# Patient Record
Sex: Female | Born: 1935 | Race: White | Hispanic: No | Smoking: Former smoker
Health system: Southern US, Community
[De-identification: ages and names within clinical notes are randomized; demographics above are authoritative.]

## PROBLEM LIST (undated history)

## (undated) DIAGNOSIS — E119 Type 2 diabetes mellitus without complications: Secondary | ICD-10-CM

## (undated) DIAGNOSIS — I10 Essential (primary) hypertension: Secondary | ICD-10-CM

## (undated) DIAGNOSIS — E785 Hyperlipidemia, unspecified: Secondary | ICD-10-CM

## (undated) DIAGNOSIS — M1990 Unspecified osteoarthritis, unspecified site: Secondary | ICD-10-CM

## (undated) DIAGNOSIS — E1140 Type 2 diabetes mellitus with diabetic neuropathy, unspecified: Secondary | ICD-10-CM

## (undated) DIAGNOSIS — M810 Age-related osteoporosis without current pathological fracture: Secondary | ICD-10-CM

## (undated) DIAGNOSIS — H409 Unspecified glaucoma: Secondary | ICD-10-CM

## (undated) DIAGNOSIS — E876 Hypokalemia: Secondary | ICD-10-CM

## (undated) HISTORY — PX: APPENDECTOMY: SHX54

## (undated) HISTORY — DX: Unspecified osteoarthritis, unspecified site: M19.90

## (undated) HISTORY — DX: Hyperlipidemia, unspecified: E78.5

## (undated) HISTORY — PX: ABDOMINAL HYSTERECTOMY: SHX81

## (undated) HISTORY — PX: CHOLECYSTECTOMY: SHX55

## (undated) HISTORY — DX: Unspecified glaucoma: H40.9

## (undated) HISTORY — DX: Type 2 diabetes mellitus with diabetic neuropathy, unspecified: E11.40

## (undated) HISTORY — DX: Age-related osteoporosis without current pathological fracture: M81.0

## (undated) HISTORY — DX: Essential (primary) hypertension: I10

## (undated) HISTORY — DX: Type 2 diabetes mellitus without complications: E11.9

## (undated) HISTORY — DX: Hypokalemia: E87.6

## (undated) HISTORY — PX: FRACTURE SURGERY: SHX138

---

## 1997-11-25 ENCOUNTER — Encounter: Payer: Self-pay | Admitting: Emergency Medicine

## 1997-11-25 ENCOUNTER — Inpatient Hospital Stay (HOSPITAL_COMMUNITY): Admission: EM | Admit: 1997-11-25 | Discharge: 1997-11-28 | Payer: Self-pay | Admitting: Emergency Medicine

## 1997-11-25 ENCOUNTER — Encounter: Payer: Self-pay | Admitting: Specialist

## 2006-04-24 ENCOUNTER — Ambulatory Visit (HOSPITAL_COMMUNITY): Admission: RE | Admit: 2006-04-24 | Discharge: 2006-04-24 | Payer: Self-pay | Admitting: Ophthalmology

## 2010-03-02 LAB — HEMOGLOBIN A1C: Hgb A1c MFr Bld: 7.1 % — AB (ref 4.0–6.0)

## 2010-03-02 LAB — HM PAP SMEAR

## 2010-04-09 ENCOUNTER — Encounter: Payer: Self-pay | Admitting: Nurse Practitioner

## 2010-04-09 DIAGNOSIS — I10 Essential (primary) hypertension: Secondary | ICD-10-CM | POA: Insufficient documentation

## 2010-04-09 DIAGNOSIS — E119 Type 2 diabetes mellitus without complications: Secondary | ICD-10-CM | POA: Insufficient documentation

## 2010-04-09 DIAGNOSIS — M1990 Unspecified osteoarthritis, unspecified site: Secondary | ICD-10-CM

## 2010-04-09 DIAGNOSIS — E1140 Type 2 diabetes mellitus with diabetic neuropathy, unspecified: Secondary | ICD-10-CM | POA: Insufficient documentation

## 2010-04-09 DIAGNOSIS — F411 Generalized anxiety disorder: Secondary | ICD-10-CM | POA: Insufficient documentation

## 2010-04-09 DIAGNOSIS — E785 Hyperlipidemia, unspecified: Secondary | ICD-10-CM | POA: Insufficient documentation

## 2012-04-18 ENCOUNTER — Other Ambulatory Visit: Payer: Self-pay | Admitting: Nurse Practitioner

## 2012-05-17 ENCOUNTER — Ambulatory Visit: Payer: Self-pay | Admitting: Nurse Practitioner

## 2012-05-24 ENCOUNTER — Other Ambulatory Visit: Payer: Self-pay | Admitting: Nurse Practitioner

## 2012-05-25 ENCOUNTER — Other Ambulatory Visit: Payer: Self-pay | Admitting: Nurse Practitioner

## 2012-06-21 ENCOUNTER — Encounter: Payer: Self-pay | Admitting: Nurse Practitioner

## 2012-06-21 ENCOUNTER — Ambulatory Visit (INDEPENDENT_AMBULATORY_CARE_PROVIDER_SITE_OTHER): Payer: Medicare Other | Admitting: Nurse Practitioner

## 2012-06-21 VITALS — BP 141/64 | HR 68 | Temp 98.6°F | Ht 64.0 in | Wt 245.0 lb

## 2012-06-21 DIAGNOSIS — E785 Hyperlipidemia, unspecified: Secondary | ICD-10-CM

## 2012-06-21 DIAGNOSIS — E1142 Type 2 diabetes mellitus with diabetic polyneuropathy: Secondary | ICD-10-CM

## 2012-06-21 DIAGNOSIS — E1140 Type 2 diabetes mellitus with diabetic neuropathy, unspecified: Secondary | ICD-10-CM

## 2012-06-21 DIAGNOSIS — F3289 Other specified depressive episodes: Secondary | ICD-10-CM

## 2012-06-21 DIAGNOSIS — F32A Depression, unspecified: Secondary | ICD-10-CM

## 2012-06-21 DIAGNOSIS — E1149 Type 2 diabetes mellitus with other diabetic neurological complication: Secondary | ICD-10-CM

## 2012-06-21 DIAGNOSIS — I10 Essential (primary) hypertension: Secondary | ICD-10-CM

## 2012-06-21 DIAGNOSIS — E119 Type 2 diabetes mellitus without complications: Secondary | ICD-10-CM

## 2012-06-21 DIAGNOSIS — F329 Major depressive disorder, single episode, unspecified: Secondary | ICD-10-CM

## 2012-06-21 LAB — COMPLETE METABOLIC PANEL WITH GFR
ALT: 22 U/L (ref 0–35)
AST: 18 U/L (ref 0–37)
Alkaline Phosphatase: 58 U/L (ref 39–117)
Creat: 0.97 mg/dL (ref 0.50–1.10)
Sodium: 143 mEq/L (ref 135–145)
Total Bilirubin: 0.4 mg/dL (ref 0.3–1.2)

## 2012-06-21 LAB — POCT GLYCOSYLATED HEMOGLOBIN (HGB A1C): Hemoglobin A1C: 7

## 2012-06-21 NOTE — Progress Notes (Signed)
Subjective:    Patient ID: Kimberly Mcdowell, female    DOB: 16-Oct-1930, 79 y.o.   MRN: [id number]  Hypertension This is a chronic problem. The current episode started more than 1 year ago. The problem is unchanged. The problem is uncontrolled (At home blood pressure runs in 140's systolic.). Pertinent negatives include no blurred vision, chest pain, headaches, malaise/fatigue, peripheral edema or shortness of breath. There are no associated agents to hypertension. Risk factors for coronary artery disease include diabetes mellitus, dyslipidemia, obesity, post-menopausal state and sedentary lifestyle. Past treatments include diuretics, ACE inhibitors and beta blockers. The current treatment provides moderate improvement. Compliance problems include diet and exercise.   Hyperlipidemia This is a chronic problem. The current episode started more than 1 year ago. The problem is controlled. Recent lipid tests were reviewed and are normal. Exacerbating diseases include diabetes. She has no history of hypothyroidism or liver disease. There are no known factors aggravating her hyperlipidemia. Pertinent negatives include no chest pain or shortness of breath. Current antihyperlipidemic treatment includes statins. The current treatment provides moderate improvement of lipids. Compliance problems include adherence to diet and adherence to exercise.  Risk factors for coronary artery disease include diabetes mellitus, hypertension, obesity, post-menopausal and a sedentary lifestyle.  Diabetes She presents for her follow-up diabetic visit. She has type 2 diabetes mellitus. No MedicAlert identification noted. The initial diagnosis of diabetes was made 20 years ago. Her disease course has been stable. There are no hypoglycemic associated symptoms. Pertinent negatives for hypoglycemia include no headaches. Pertinent negatives for diabetes include no blurred vision, no chest pain, no polydipsia, no polyphagia and no weakness.  There are no hypoglycemic complications. Symptoms are stable. There are no diabetic complications. Risk factors for coronary artery disease include dyslipidemia, hypertension, obesity, post-menopausal and sedentary lifestyle. When asked about current treatments, none were reported. She is compliant with treatment most of the time. Her weight is fluctuating minimally. When asked about meal planning, she reported none. She has not had a previous visit with a dietician. She rarely participates in exercise. Her home blood glucose trend is fluctuating minimally. Her breakfast blood glucose is taken between 8-9 am. Her breakfast blood glucose range is generally 130-140 mg/dl. Her highest blood glucose is >200 mg/dl. An ACE inhibitor/angiotensin II receptor blocker is being taken. She does not see a podiatrist.Eye exam is current (May 2014).  depression Celexa- Keeps her calm and from worrying so much Diabetic neuropathy bil legs and feet Neurotin helps with discomfort- No medication side effects   Review of Systems  Constitutional: Negative for malaise/fatigue.  Eyes: Negative for blurred vision.  Respiratory: Negative for shortness of breath.   Cardiovascular: Negative for chest pain.  Endocrine: Negative for polydipsia and polyphagia.  Neurological: Negative for weakness and headaches.  All other systems reviewed and are negative.       Objective:   Physical Exam  Constitutional: She is oriented to person, place, and time. She appears well-developed and well-nourished.  HENT:  Nose: Nose normal.  Mouth/Throat: Oropharynx is clear and moist.  Eyes: EOM are normal.  Neck: Trachea normal, normal range of motion and full passive range of motion without pain. Neck supple. No JVD present. Carotid bruit is not present. No thyromegaly present.  Cardiovascular: Normal rate, regular rhythm, normal heart sounds and intact distal pulses.  Exam reveals no gallop and no friction rub.   No murmur  heard. Pulmonary/Chest: Effort normal and breath sounds normal.  Abdominal: Soft. Bowel sounds are normal. She exhibits no  distension and no mass. There is no tenderness.  Musculoskeletal: Normal range of motion.  Lymphadenopathy:    She has no cervical adenopathy.  Neurological: She is alert and oriented to person, place, and time. She has normal reflexes.  Skin: Skin is warm and dry.  Psychiatric: She has a normal mood and affect. Her behavior is normal. Judgment and thought content normal.   BP 141/64  Pulse 68  Temp(Src) 98.6 F (37 C) (Oral)  Ht 5\' 4"  (1.626 m)  Wt 245 lb (111.131 kg)  BMI 42.03 kg/m2 Results for orders placed in visit on 06/21/12  POCT GLYCOSYLATED HEMOGLOBIN (HGB A1C)      Result Value Range   Hemoglobin A1C 7.0           Assessment & Plan:   1. Diabetes mellitus   2. Hyperlipidemia   3. Hypertension   4. Diabetic neuropathy, type II diabetes mellitus   5. Depression    Orders Placed This Encounter  Procedures  . COMPLETE METABOLIC PANEL WITH GFR  . NMR Lipoprofile with Lipids  . POCT glycosylated hemoglobin (Hb A1C)     Medication List       These changes are accurate as of: 06/21/2012 11:31 AM. If you have any questions, ask your nurse or doctor.          TAKE these medications       amLODipine 10 MG tablet  Commonly known as:  NORVASC  TAKE ONE TABLET BY MOUTH ONE TIME DAILY     benazepril 40 MG tablet  Commonly known as:  LOTENSIN  Take 40 mg by mouth daily.     citalopram 20 MG tablet  Commonly known as:  CELEXA  TAKE ONE TABLET BY MOUTH ONE TIME DAILY     furosemide 40 MG tablet  Commonly known as:  LASIX  Take 40 mg by mouth daily.     gabapentin 100 MG capsule  Commonly known as:  NEURONTIN  Take 100 mg by mouth 3 (three) times daily.     glipiZIDE 2.5 MG 24 hr tablet  Commonly known as:  GLUCOTROL XL  Take 2.5 mg by mouth daily.     meclizine 25 MG tablet  Commonly known as:  ANTIVERT  Take 25 mg by mouth 3  (three) times daily as needed.     metFORMIN 1000 MG tablet  Commonly known as:  GLUCOPHAGE  TAKE ONE TABLET BY MOUTH TWICE DAILY     metoprolol 50 MG tablet  Commonly known as:  LOPRESSOR  Take 50 mg by mouth 2 (two) times daily.     pravastatin 40 MG tablet  Commonly known as:  PRAVACHOL  Take 40 mg by mouth daily.     Vitamin D3 2000 UNITS Tabs  Take 2,000 Int'l Units by mouth daily.       Continue all meds Labs pending Diabetic diet encouraged Exercise Health maintenance reviewed- Patient refuses Dexa and mammogram Low Na+ diet  Mary-Margaret Daphine Deutscher, FNP

## 2012-06-21 NOTE — Patient Instructions (Signed)

## 2012-06-25 ENCOUNTER — Other Ambulatory Visit: Payer: Self-pay | Admitting: Nurse Practitioner

## 2012-06-25 LAB — NMR LIPOPROFILE WITH LIPIDS
Cholesterol, Total: 183 mg/dL (ref <200)
HDL Particle Number: 36.9 umol/L (ref >=30.5)
HDL-C: 51 mg/dL (ref >=40)
LDL (calc): 104 mg/dL — ABNORMAL HIGH (ref <100)
LDL Size: 20.6 nm (ref >20.5)
LP-IR Score: 62 — ABNORMAL HIGH (ref <=45)
Triglycerides: 142 mg/dL (ref <150)

## 2012-06-25 MED ORDER — ATORVASTATIN CALCIUM 40 MG PO TABS: 40.0000 mg | ORAL_TABLET | Freq: Every day | ORAL | Status: DC

## 2012-07-03 ENCOUNTER — Other Ambulatory Visit: Payer: Self-pay | Admitting: Nurse Practitioner

## 2012-07-08 ENCOUNTER — Other Ambulatory Visit: Payer: Self-pay | Admitting: *Deleted

## 2012-07-08 MED ORDER — GABAPENTIN 100 MG PO CAPS: 100.0000 mg | ORAL_CAPSULE | Freq: Three times a day (TID) | ORAL | Status: DC

## 2012-08-09 ENCOUNTER — Other Ambulatory Visit: Payer: Self-pay | Admitting: Nurse Practitioner

## 2012-09-12 ENCOUNTER — Other Ambulatory Visit: Payer: Self-pay | Admitting: Nurse Practitioner

## 2012-09-30 ENCOUNTER — Encounter: Payer: Self-pay | Admitting: Nurse Practitioner

## 2012-09-30 ENCOUNTER — Ambulatory Visit (INDEPENDENT_AMBULATORY_CARE_PROVIDER_SITE_OTHER): Payer: Medicare Other | Admitting: Nurse Practitioner

## 2012-09-30 VITALS — BP 156/61 | HR 66 | Temp 97.0°F | Ht 64.0 in | Wt 245.0 lb

## 2012-09-30 DIAGNOSIS — E1142 Type 2 diabetes mellitus with diabetic polyneuropathy: Secondary | ICD-10-CM

## 2012-09-30 DIAGNOSIS — F32A Depression, unspecified: Secondary | ICD-10-CM

## 2012-09-30 DIAGNOSIS — E785 Hyperlipidemia, unspecified: Secondary | ICD-10-CM

## 2012-09-30 DIAGNOSIS — E1149 Type 2 diabetes mellitus with other diabetic neurological complication: Secondary | ICD-10-CM

## 2012-09-30 DIAGNOSIS — E1140 Type 2 diabetes mellitus with diabetic neuropathy, unspecified: Secondary | ICD-10-CM

## 2012-09-30 DIAGNOSIS — E119 Type 2 diabetes mellitus without complications: Secondary | ICD-10-CM

## 2012-09-30 DIAGNOSIS — I10 Essential (primary) hypertension: Secondary | ICD-10-CM

## 2012-09-30 DIAGNOSIS — F329 Major depressive disorder, single episode, unspecified: Secondary | ICD-10-CM

## 2012-09-30 DIAGNOSIS — F3289 Other specified depressive episodes: Secondary | ICD-10-CM

## 2012-09-30 LAB — POCT GLYCOSYLATED HEMOGLOBIN (HGB A1C): Hemoglobin A1C: 6.7

## 2012-09-30 MED ORDER — BENAZEPRIL HCL 40 MG PO TABS: 40.0000 mg | ORAL_TABLET | Freq: Every day | ORAL | Status: DC

## 2012-09-30 MED ORDER — CITALOPRAM HYDROBROMIDE 20 MG PO TABS: 20.0000 mg | ORAL_TABLET | Freq: Every day | ORAL | Status: DC

## 2012-09-30 MED ORDER — METOPROLOL TARTRATE 50 MG PO TABS: 50.0000 mg | ORAL_TABLET | Freq: Two times a day (BID) | ORAL | Status: DC

## 2012-09-30 MED ORDER — GLIPIZIDE ER 2.5 MG PO TB24: 2.5000 mg | ORAL_TABLET | Freq: Every day | ORAL | Status: DC

## 2012-09-30 MED ORDER — ATORVASTATIN CALCIUM 40 MG PO TABS: 40.0000 mg | ORAL_TABLET | Freq: Every day | ORAL | Status: DC

## 2012-09-30 MED ORDER — GABAPENTIN 100 MG PO CAPS: 100.0000 mg | ORAL_CAPSULE | Freq: Three times a day (TID) | ORAL | Status: DC

## 2012-09-30 MED ORDER — METFORMIN HCL 1000 MG PO TABS: 1000.0000 mg | ORAL_TABLET | Freq: Two times a day (BID) | ORAL | Status: DC

## 2012-09-30 NOTE — Progress Notes (Signed)
Subjective:    Patient ID: Kimberly Mcdowell, female    DOB: [birth year], 79 y.o.   MRN: [id number]  Hypertension This is a chronic problem. The current episode started more than 1 year ago. The problem is unchanged. The problem is uncontrolled (At home blood pressure runs in 140's systolic.). Pertinent negatives include no blurred vision, chest pain, headaches, malaise/fatigue, peripheral edema or shortness of breath. There are no associated agents to hypertension. Risk factors for coronary artery disease include diabetes mellitus, dyslipidemia, obesity, post-menopausal state and sedentary lifestyle. Past treatments include diuretics, ACE inhibitors and beta blockers. The current treatment provides moderate improvement. Compliance problems include diet and exercise.   Hyperlipidemia This is a chronic problem. The current episode started more than 1 year ago. The problem is controlled. Recent lipid tests were reviewed and are normal. Exacerbating diseases include diabetes. She has no history of hypothyroidism or liver disease. There are no known factors aggravating her hyperlipidemia. Pertinent negatives include no chest pain or shortness of breath. Current antihyperlipidemic treatment includes statins. The current treatment provides moderate improvement of lipids. Compliance problems include adherence to diet and adherence to exercise.  Risk factors for coronary artery disease include diabetes mellitus, hypertension, obesity, post-menopausal and a sedentary lifestyle.  Diabetes She presents for her follow-up diabetic visit. She has type 2 diabetes mellitus. No MedicAlert identification noted. The initial diagnosis of diabetes was made 20 years ago. Her disease course has been stable. There are no hypoglycemic associated symptoms. Pertinent negatives for hypoglycemia include no headaches. Pertinent negatives for diabetes include no blurred vision, no chest pain, no polydipsia, no polyphagia and no weakness.  There are no hypoglycemic complications. Symptoms are stable. There are no diabetic complications. Risk factors for coronary artery disease include dyslipidemia, hypertension, obesity, post-menopausal and sedentary lifestyle. When asked about current treatments, none were reported. She is compliant with treatment most of the time. Her weight is fluctuating minimally. When asked about meal planning, she reported none. She has not had a previous visit with a dietician. She rarely participates in exercise. Her home blood glucose trend is fluctuating minimally. Her breakfast blood glucose is taken between 8-9 am. Her breakfast blood glucose range is generally 130-140 mg/dl. Her highest blood glucose is >200 mg/dl. An ACE inhibitor/angiotensin II receptor blocker is being taken. She does not see a podiatrist.Eye exam is current (May 2014).  depression Celexa- Keeps her calm and from worrying so much Diabetic neuropathy bil legs and feet Neurotin helps with discomfort- No medication side effects   Review of Systems  Constitutional: Negative for malaise/fatigue.  Eyes: Negative for blurred vision.  Respiratory: Negative for shortness of breath.   Cardiovascular: Negative for chest pain.  Endocrine: Negative for polydipsia and polyphagia.  Neurological: Negative for weakness and headaches.  All other systems reviewed and are negative.       Objective:   Physical Exam  Constitutional: She is oriented to person, place, and time. She appears well-developed and well-nourished.  HENT:  Nose: Nose normal.  Mouth/Throat: Oropharynx is clear and moist.  Eyes: EOM are normal.  Neck: Trachea normal, normal range of motion and full passive range of motion without pain. Neck supple. No JVD present. Carotid bruit is not present. No thyromegaly present.  Cardiovascular: Normal rate, regular rhythm, normal heart sounds and intact distal pulses.  Exam reveals no gallop and no friction rub.   No murmur  heard. Pulmonary/Chest: Effort normal and breath sounds normal.  Abdominal: Soft. Bowel sounds are normal. She exhibits no  distension and no mass. There is no tenderness.  Musculoskeletal: Normal range of motion.  Lymphadenopathy:    She has no cervical adenopathy.  Neurological: She is alert and oriented to person, place, and time. She has normal reflexes.  Skin: Skin is warm and dry.  Psychiatric: She has a normal mood and affect. Her behavior is normal. Judgment and thought content normal.   BP 156/61  Pulse 66  Temp(Src) 97 F (36.1 C) (Oral)  Ht 5\' 4"  (1.626 m)  Wt 245 lb (111.131 kg)  BMI 42.03 kg/m2 Results for orders placed in visit on 09/30/12  POCT GLYCOSYLATED HEMOGLOBIN (HGB A1C)      Result Value Range   Hemoglobin A1C 6.7           Assessment & Plan:   1. Diabetes   2. Accelerated hypertension   3. Hyperlipemia    Orders Placed This Encounter  Procedures  . CMP14+EGFR  . NMR, lipoprofile  . POCT glycosylated hemoglobin (Hb A1C)     Medication List       This list is accurate as of: 09/30/12 10:53 AM.  Always use your most recent med list.               atorvastatin 40 MG tablet  Commonly known as:  LIPITOR  Take 1 tablet (40 mg total) by mouth daily.     benazepril 40 MG tablet  Commonly known as:  LOTENSIN  Take 40 mg by mouth daily.     citalopram 20 MG tablet  Commonly known as:  CELEXA  TAKE ONE TABLET BY MOUTH ONE TIME DAILY     furosemide 40 MG tablet  Commonly known as:  LASIX  Take 40 mg by mouth daily.     gabapentin 100 MG capsule  Commonly known as:  NEURONTIN  Take 1 capsule (100 mg total) by mouth 3 (three) times daily.     glipiZIDE 2.5 MG 24 hr tablet  Commonly known as:  GLUCOTROL XL  Take 2.5 mg by mouth daily.     meclizine 25 MG tablet  Commonly known as:  ANTIVERT  Take 25 mg by mouth 3 (three) times daily as needed.     metFORMIN 1000 MG tablet  Commonly known as:  GLUCOPHAGE  TAKE ONE TABLET BY MOUTH TWICE  DAILY     metoprolol 50 MG tablet  Commonly known as:  LOPRESSOR  TAKE ONE TABLET BY MOUTH TWICE DAILY     Vitamin D3 2000 UNITS Tabs  Take 2,000 Int'l Units by mouth daily.       Continue all meds Labs pending Diabetic diet encouraged Exercise Health maintenance reviewed- Patient refuses Dexa and mammogram Low Na+ diet  Mary-Margaret Daphine Deutscher, FNP

## 2012-09-30 NOTE — Patient Instructions (Signed)

## 2012-10-02 LAB — CMP14+EGFR
ALT: 14 IU/L (ref 0–32)
AST: 12 IU/L (ref 0–40)
Albumin/Globulin Ratio: 2.2 (ref 1.1–2.5)
CO2: 25 mmol/L (ref 18–29)
Calcium: 9.5 mg/dL (ref 8.6–10.2)
Creatinine, Ser: 0.87 mg/dL (ref 0.57–1.00)
GFR calc non Af Amer: 64 mL/min/{1.73_m2} (ref >59)
Glucose: 142 mg/dL — ABNORMAL HIGH (ref 65–99)
Potassium: 5.9 mmol/L — ABNORMAL HIGH (ref 3.5–5.2)
Total Protein: 6.3 g/dL (ref 6.0–8.5)

## 2012-10-02 LAB — NMR, LIPOPROFILE
HDL Cholesterol by NMR: 47 mg/dL (ref >=40)
LDLC SERPL CALC-MCNC: 61 mg/dL (ref <100)
Small LDL Particle Number: 845 nmol/L — ABNORMAL HIGH (ref <=527)
Triglycerides by NMR: 121 mg/dL (ref <150)

## 2012-10-21 NOTE — Progress Notes (Signed)
Pt.notified

## 2013-01-03 ENCOUNTER — Ambulatory Visit (INDEPENDENT_AMBULATORY_CARE_PROVIDER_SITE_OTHER): Payer: Medicare Other | Admitting: Nurse Practitioner

## 2013-01-03 ENCOUNTER — Encounter: Payer: Self-pay | Admitting: Nurse Practitioner

## 2013-01-03 VITALS — BP 156/51 | HR 62 | Temp 97.8°F | Ht 64.0 in | Wt 242.0 lb

## 2013-01-03 DIAGNOSIS — E1142 Type 2 diabetes mellitus with diabetic polyneuropathy: Secondary | ICD-10-CM

## 2013-01-03 DIAGNOSIS — E1149 Type 2 diabetes mellitus with other diabetic neurological complication: Secondary | ICD-10-CM

## 2013-01-03 DIAGNOSIS — E785 Hyperlipidemia, unspecified: Secondary | ICD-10-CM

## 2013-01-03 DIAGNOSIS — E1140 Type 2 diabetes mellitus with diabetic neuropathy, unspecified: Secondary | ICD-10-CM

## 2013-01-03 DIAGNOSIS — I10 Essential (primary) hypertension: Secondary | ICD-10-CM

## 2013-01-03 DIAGNOSIS — F411 Generalized anxiety disorder: Secondary | ICD-10-CM

## 2013-01-03 DIAGNOSIS — M129 Arthropathy, unspecified: Secondary | ICD-10-CM

## 2013-01-03 DIAGNOSIS — E119 Type 2 diabetes mellitus without complications: Secondary | ICD-10-CM

## 2013-01-03 DIAGNOSIS — M1990 Unspecified osteoarthritis, unspecified site: Secondary | ICD-10-CM

## 2013-01-03 LAB — POCT GLYCOSYLATED HEMOGLOBIN (HGB A1C): Hemoglobin A1C: 6.7

## 2013-01-03 MED ORDER — CITALOPRAM HYDROBROMIDE 40 MG PO TABS: 40.0000 mg | ORAL_TABLET | Freq: Every day | ORAL | Status: DC

## 2013-01-03 NOTE — Patient Instructions (Signed)
Stress Management Stress is a state of physical or mental tension that often results from changes in your life or normal routine. Some common causes of stress are:  Death of a loved one.  Injuries or severe illnesses.  Getting fired or changing jobs.  Moving into a new home. Other causes may be:  Sexual problems.  Business or financial losses.  Taking on a large debt.  Regular conflict with someone at home or at work.  Constant tiredness from lack of sleep. It is not just bad things that are stressful. It may be stressful to:  Win the lottery.  Get married.  Buy a new car. The amount of stress that can be easily tolerated varies from person to person. Changes generally cause stress, regardless of the types of change. Too much stress can affect your health. It may lead to physical or emotional problems. Too little stress (boredom) may also become stressful. SUGGESTIONS TO REDUCE STRESS:  Talk things over with your family and friends. It often is helpful to share your concerns and worries. If you feel your problem is serious, you may want to get help from a professional counselor.  Consider your problems one at a time instead of lumping them all together. Trying to take care of everything at once may seem impossible. List all the things you need to do and then start with the most important one. Set a goal to accomplish 2 or 3 things each day. If you expect to do too many in a single day you will naturally fail, causing you to feel even more stressed.  Do not use alcohol or drugs to relieve stress. Although you may feel better for a short time, they do not remove the problems that caused the stress. They can also be habit forming.  Exercise regularly - at least 3 times per week. Physical exercise can help to relieve that "uptight" feeling and will relax you.  The shortest distance between despair and hope is often a good night's sleep.  Go to bed and get up on time allowing  yourself time for appointments without being rushed.  Take a short "time-out" period from any stressful situation that occurs during the day. Close your eyes and take some deep breaths. Starting with the muscles in your face, tense them, hold it for a few seconds, then relax. Repeat this with the muscles in your neck, shoulders, hand, stomach, back and legs.  Take good care of yourself. Eat a balanced diet and get plenty of rest.  Schedule time for having fun. Take a break from your daily routine to relax. HOME CARE INSTRUCTIONS   Call if you feel overwhelmed by your problems and feel you can no longer manage them on your own.  Return immediately if you feel like hurting yourself or someone else. Document Released: 07/05/2000 Document Revised: 04/03/2011 Document Reviewed: 09/03/2012 [employer] Patient Information 2014 [employer].  
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
## 2013-01-05 LAB — CMP14+EGFR
ALT: 11 IU/L (ref 0–32)
AST: 10 IU/L (ref 0–40)
CO2: 23 mmol/L (ref 18–29)
Calcium: 9.4 mg/dL (ref 8.6–10.2)
Creatinine, Ser: 0.92 mg/dL (ref 0.57–1.00)
Glucose: 127 mg/dL — ABNORMAL HIGH (ref 65–99)
Potassium: 5.7 mmol/L — ABNORMAL HIGH (ref 3.5–5.2)
Sodium: 144 mmol/L (ref 134–144)
Total Protein: 6.2 g/dL (ref 6.0–8.5)

## 2013-01-05 LAB — NMR, LIPOPROFILE
HDL Cholesterol by NMR: 49 mg/dL (ref >=40)
LDLC SERPL CALC-MCNC: 57 mg/dL (ref <100)

## 2013-02-19 ENCOUNTER — Telehealth: Payer: Self-pay | Admitting: Nurse Practitioner

## 2013-02-19 MED ORDER — BENZONATATE 100 MG PO CAPS: 100.0000 mg | ORAL_CAPSULE | Freq: Two times a day (BID) | ORAL | Status: DC | PRN

## 2013-02-19 NOTE — Telephone Encounter (Signed)
rx for tesalon perles sent to pharmacy for cough

## 2013-02-19 NOTE — Telephone Encounter (Signed)
Please review

## 2013-05-02 ENCOUNTER — Other Ambulatory Visit: Payer: Self-pay | Admitting: Nurse Practitioner

## 2013-05-12 ENCOUNTER — Other Ambulatory Visit: Payer: Self-pay | Admitting: *Deleted

## 2013-05-12 DIAGNOSIS — I10 Essential (primary) hypertension: Secondary | ICD-10-CM

## 2013-05-12 MED ORDER — BENAZEPRIL HCL 40 MG PO TABS: 40.0000 mg | ORAL_TABLET | Freq: Every day | ORAL | Status: DC

## 2013-05-12 NOTE — Telephone Encounter (Signed)
Last ov 01/03/13

## 2013-05-12 NOTE — Telephone Encounter (Signed)
Patient NTBS for follow up and lab work  

## 2013-05-13 ENCOUNTER — Other Ambulatory Visit: Payer: Self-pay | Admitting: Nurse Practitioner

## 2013-05-15 NOTE — Telephone Encounter (Signed)
Last seen and last glucose 12/24/12  MMM  Requesting 90 day supply

## 2013-05-15 NOTE — Telephone Encounter (Signed)
Patient NTBS for follow up and lab work  

## 2013-05-21 ENCOUNTER — Encounter: Payer: Self-pay | Admitting: *Deleted

## 2013-06-04 ENCOUNTER — Ambulatory Visit: Payer: Medicare Other | Admitting: Nurse Practitioner

## 2013-06-05 ENCOUNTER — Ambulatory Visit (INDEPENDENT_AMBULATORY_CARE_PROVIDER_SITE_OTHER): Payer: Medicare HMO | Admitting: Nurse Practitioner

## 2013-06-05 ENCOUNTER — Encounter: Payer: Self-pay | Admitting: Nurse Practitioner

## 2013-06-05 VITALS — BP 172/67 | HR 67 | Temp 99.2°F | Ht 64.0 in | Wt 238.4 lb

## 2013-06-05 DIAGNOSIS — I10 Essential (primary) hypertension: Secondary | ICD-10-CM

## 2013-06-05 DIAGNOSIS — E1142 Type 2 diabetes mellitus with diabetic polyneuropathy: Secondary | ICD-10-CM

## 2013-06-05 DIAGNOSIS — J209 Acute bronchitis, unspecified: Secondary | ICD-10-CM

## 2013-06-05 DIAGNOSIS — F411 Generalized anxiety disorder: Secondary | ICD-10-CM

## 2013-06-05 DIAGNOSIS — M1990 Unspecified osteoarthritis, unspecified site: Secondary | ICD-10-CM

## 2013-06-05 DIAGNOSIS — E1149 Type 2 diabetes mellitus with other diabetic neurological complication: Secondary | ICD-10-CM

## 2013-06-05 DIAGNOSIS — E119 Type 2 diabetes mellitus without complications: Secondary | ICD-10-CM

## 2013-06-05 DIAGNOSIS — E785 Hyperlipidemia, unspecified: Secondary | ICD-10-CM

## 2013-06-05 DIAGNOSIS — M129 Arthropathy, unspecified: Secondary | ICD-10-CM

## 2013-06-05 DIAGNOSIS — E1140 Type 2 diabetes mellitus with diabetic neuropathy, unspecified: Secondary | ICD-10-CM

## 2013-06-05 LAB — POCT GLYCOSYLATED HEMOGLOBIN (HGB A1C)

## 2013-06-05 MED ORDER — AMOXICILLIN 875 MG PO TABS: 875.0000 mg | ORAL_TABLET | Freq: Two times a day (BID) | ORAL | Status: DC

## 2013-06-05 MED ORDER — BENZONATATE 100 MG PO CAPS: 100.0000 mg | ORAL_CAPSULE | Freq: Two times a day (BID) | ORAL | Status: DC | PRN

## 2013-06-05 NOTE — Patient Instructions (Signed)

## 2013-06-05 NOTE — Progress Notes (Signed)
Subjective:    Patient ID: Kimberly Mcdowell, female    DOB: 06-25-1930, 80 y.o.   MRN: [id number]  Patient here today for follow up of chronic medical problems.  Cough This is a recurrent problem. The current episode started in the past 7 days. Associated symptoms include a fever (low grade), nasal congestion, postnasal drip and a sore throat. Pertinent negatives include no chest pain, headaches or shortness of breath. The symptoms are aggravated by lying down. The treatment provided no relief. Her past medical history is significant for bronchitis.  Hypertension This is a chronic problem. The current episode started more than 1 year ago. The problem is unchanged. The problem is uncontrolled (At home blood pressure runs in 169'C systolic.). Pertinent negatives include no blurred vision, chest pain, headaches, malaise/fatigue, peripheral edema or shortness of breath. There are no associated agents to hypertension. Risk factors for coronary artery disease include diabetes mellitus, dyslipidemia, obesity, post-menopausal state and sedentary lifestyle. Past treatments include diuretics, ACE inhibitors and beta blockers. The current treatment provides moderate improvement. Compliance problems include diet and exercise.   Hyperlipidemia This is a chronic problem. The current episode started more than 1 year ago. The problem is controlled. Recent lipid tests were reviewed and are normal. Exacerbating diseases include diabetes. She has no history of hypothyroidism or liver disease. There are no known factors aggravating her hyperlipidemia. Pertinent negatives include no chest pain or shortness of breath. Current antihyperlipidemic treatment includes statins. The current treatment provides moderate improvement of lipids. Compliance problems include adherence to diet and adherence to exercise.  Risk factors for coronary artery disease include diabetes mellitus, hypertension, obesity, post-menopausal and a sedentary  lifestyle.  Diabetes She presents for her follow-up diabetic visit. She has type 2 diabetes mellitus. No MedicAlert identification noted. The initial diagnosis of diabetes was made 20 years ago. Her disease course has been stable. There are no hypoglycemic associated symptoms. Pertinent negatives for hypoglycemia include no headaches. Pertinent negatives for diabetes include no blurred vision, no chest pain, no polydipsia, no polyphagia and no weakness. There are no hypoglycemic complications. Symptoms are stable. There are no diabetic complications. Risk factors for coronary artery disease include dyslipidemia, hypertension, obesity, post-menopausal and sedentary lifestyle. When asked about current treatments, none were reported. She is compliant with treatment most of the time. Her weight is fluctuating minimally. When asked about meal planning, she reported none. She has not had a previous visit with a dietician. She rarely participates in exercise. Her home blood glucose trend is fluctuating minimally. Her breakfast blood glucose is taken between 8-9 am. Her breakfast blood glucose range is generally 130-140 mg/dl. Her highest blood glucose is >200 mg/dl. An ACE inhibitor/angiotensin II receptor blocker is being taken. She does not see a podiatrist.Eye exam is current (May 2014).  depression Celexa- Keeps her calm and from worrying so much Diabetic neuropathy bil legs and feet Neurotin helps with discomfort- No medication side effects   Review of Systems  Constitutional: Positive for fever (low grade). Negative for malaise/fatigue.  HENT: Positive for postnasal drip and sore throat.   Eyes: Negative for blurred vision.  Respiratory: Positive for cough. Negative for shortness of breath.   Cardiovascular: Negative for chest pain.  Endocrine: Negative for polydipsia and polyphagia.  Neurological: Negative for weakness and headaches.  All other systems reviewed and are negative.      Objective:    Physical Exam  Constitutional: She is oriented to person, place, and time. She appears well-developed and well-nourished.  HENT:  Nose: Nose normal.  Mouth/Throat: Oropharynx is clear and moist.  Eyes: EOM are normal.  Neck: Trachea normal, normal range of motion and full passive range of motion without pain. Neck supple. No JVD present. Carotid bruit is not present. No thyromegaly present.  Cardiovascular: Normal rate, regular rhythm, normal heart sounds and intact distal pulses.  Exam reveals no gallop and no friction rub.   No murmur heard. Pulmonary/Chest: Effort normal and breath sounds normal.  Abdominal: Soft. Bowel sounds are normal. She exhibits no distension and no mass. There is no tenderness.  Musculoskeletal: Normal range of motion.  Lymphadenopathy:    She has no cervical adenopathy.  Neurological: She is alert and oriented to person, place, and time. She has normal reflexes.  Skin: Skin is warm and dry.  Psychiatric: She has a normal mood and affect. Her behavior is normal. Judgment and thought content normal.   BP 172/67  Pulse 67  Temp(Src) 99.2 F (37.3 C) (Oral)  Ht 5' 4" (1.626 m)  Wt 238 lb 6.4 oz (108.138 kg)  BMI 40.90 kg/m2     Results for orders placed in visit on 06/05/13  POCT GLYCOSYLATED HEMOGLOBIN (HGB A1C)      Result Value Ref Range   Hemoglobin A1C 6.6%       Assessment & Plan:    1. Diabetes mellitus   2. Hyperlipidemia   3. Hypertension   4. GAD (generalized anxiety disorder)   5. Diabetic neuropathy   6. Arthritis   7. Acute bronchitis    Orders Placed This Encounter  Procedures  . CMP14+EGFR  . NMR, lipoprofile  . POCT glycosylated hemoglobin (Hb A1C)   Meds ordered this encounter  Medications  . amoxicillin (AMOXIL) 875 MG tablet    Sig: Take 1 tablet (875 mg total) by mouth 2 (two) times daily.    Dispense:  20 tablet    Refill:  0    Order Specific Question:  Supervising Provider    Answer:  Chipper Herb [[id number]]  .  benzonatate (TESSALON) 100 MG capsule    Sig: Take 1 capsule (100 mg total) by mouth 2 (two) times daily as needed for cough.    Dispense:  30 capsule    Refill:  0    Order Specific Question:  Supervising Provider    Answer:  Chipper Herb [[id number]]    Labs pending Health maintenance reviewed Diet and exercise encouraged Continue all meds Follow up  In 3 months 1. Take meds as prescribed 2. Use a cool mist humidifier especially during the winter months and when heat has been humid. 3. Use saline nose sprays frequently 4. Saline irrigations of the nose can be very helpful if done frequently.  * 4X daily for 1 week*  * Use of a nettie pot can be helpful with this. Follow directions with this* 5. Drink plenty of fluids 6. Keep thermostat turn down low 7.For any cough or congestion  Use plain Mucinex- regular strength or max strength is fine   * Children- consult with Pharmacist for dosing 8. For fever or aces or pains- take tylenol or ibuprofen appropriate for age and weight.  * for fevers greater than 101 orally you may alternate ibuprofen and tylenol every  3 hours.      Mary-Margaret Hassell Done, FNP

## 2013-06-06 LAB — CMP14+EGFR
ALT: 23 IU/L (ref 0–32)
AST: 19 IU/L (ref 0–40)
Albumin/Globulin Ratio: 1.8 (ref 1.1–2.5)
Albumin: 4.3 g/dL (ref 3.5–4.7)
Alkaline Phosphatase: 98 IU/L (ref 39–117)
BILIRUBIN TOTAL: 0.5 mg/dL (ref 0.0–1.2)
BUN/Creatinine Ratio: 14 (ref 11–26)
BUN: 13 mg/dL (ref 8–27)
CO2: 23 mmol/L (ref 18–29)
Calcium: 9.6 mg/dL (ref 8.7–10.3)
Chloride: 100 mmol/L (ref 97–108)
Creatinine, Ser: 0.93 mg/dL (ref 0.57–1.00)
GFR calc non Af Amer: 58 mL/min/{1.73_m2} — ABNORMAL LOW (ref >59)
GFR, EST AFRICAN AMERICAN: 67 mL/min/{1.73_m2} (ref >59)
Globulin, Total: 2.4 g/dL (ref 1.5–4.5)
Glucose: 134 mg/dL — ABNORMAL HIGH (ref 65–99)
POTASSIUM: 5.8 mmol/L — AB (ref 3.5–5.2)
Sodium: 141 mmol/L (ref 134–144)
Total Protein: 6.7 g/dL (ref 6.0–8.5)

## 2013-06-06 LAB — NMR, LIPOPROFILE
Cholesterol: 139 mg/dL (ref 100–199)
HDL Cholesterol by NMR: 55 mg/dL (ref >39)
HDL Particle Number: 36.6 umol/L (ref >=30.5)
LDL Particle Number: 771 nmol/L (ref <1000)
LDL SIZE: 20.5 nm (ref >20.5)
LDLC SERPL CALC-MCNC: 59 mg/dL (ref 0–99)
LP-IR SCORE: 47 — AB (ref <=45)
Small LDL Particle Number: 528 nmol/L — ABNORMAL HIGH (ref <=527)
Triglycerides by NMR: 124 mg/dL (ref 0–149)

## 2013-06-17 ENCOUNTER — Other Ambulatory Visit (INDEPENDENT_AMBULATORY_CARE_PROVIDER_SITE_OTHER): Payer: Medicare HMO

## 2013-06-17 DIAGNOSIS — R7989 Other specified abnormal findings of blood chemistry: Secondary | ICD-10-CM

## 2013-06-17 NOTE — Progress Notes (Signed)
Pt came in for labs only 

## 2013-06-18 LAB — BMP8+EGFR
BUN/Creatinine Ratio: 14 (ref 11–26)
BUN: 14 mg/dL (ref 8–27)
CALCIUM: 9.5 mg/dL (ref 8.7–10.3)
CO2: 26 mmol/L (ref 18–29)
CREATININE: 0.98 mg/dL (ref 0.57–1.00)
Chloride: 103 mmol/L (ref 97–108)
GFR calc Af Amer: 63 mL/min/{1.73_m2} (ref >59)
GFR, EST NON AFRICAN AMERICAN: 55 mL/min/{1.73_m2} — AB (ref >59)
Glucose: 129 mg/dL — ABNORMAL HIGH (ref 65–99)
Potassium: 5.6 mmol/L — ABNORMAL HIGH (ref 3.5–5.2)
Sodium: 143 mmol/L (ref 134–144)

## 2013-06-19 ENCOUNTER — Other Ambulatory Visit: Payer: Self-pay | Admitting: Nurse Practitioner

## 2013-06-19 MED ORDER — AMLODIPINE BESY-BENAZEPRIL HCL 2.5-10 MG PO CAPS: 1.0000 | ORAL_CAPSULE | Freq: Every day | ORAL | Status: DC

## 2013-06-25 ENCOUNTER — Other Ambulatory Visit (INDEPENDENT_AMBULATORY_CARE_PROVIDER_SITE_OTHER): Payer: Medicare HMO

## 2013-06-25 DIAGNOSIS — R7989 Other specified abnormal findings of blood chemistry: Secondary | ICD-10-CM

## 2013-06-26 LAB — POTASSIUM: Potassium: 5.7 mmol/L — ABNORMAL HIGH (ref 3.5–5.2)

## 2013-06-30 ENCOUNTER — Other Ambulatory Visit: Payer: Self-pay | Admitting: Nurse Practitioner

## 2013-06-30 MED ORDER — KALEXATE PO POWD
15.0000 g | Freq: Every day | ORAL | Status: DC
Start: 2013-06-30 — End: 2013-09-08

## 2013-07-17 ENCOUNTER — Other Ambulatory Visit: Payer: Self-pay | Admitting: Nurse Practitioner

## 2013-09-03 ENCOUNTER — Telehealth: Payer: Self-pay | Admitting: Nurse Practitioner

## 2013-09-03 NOTE — Telephone Encounter (Signed)
error 

## 2013-09-08 ENCOUNTER — Ambulatory Visit (INDEPENDENT_AMBULATORY_CARE_PROVIDER_SITE_OTHER): Payer: Medicare HMO | Admitting: Nurse Practitioner

## 2013-09-08 ENCOUNTER — Encounter: Payer: Self-pay | Admitting: Nurse Practitioner

## 2013-09-08 VITALS — BP 156/63 | HR 62 | Temp 97.2°F | Ht 64.0 in | Wt 247.0 lb

## 2013-09-08 DIAGNOSIS — I10 Essential (primary) hypertension: Secondary | ICD-10-CM

## 2013-09-08 DIAGNOSIS — E119 Type 2 diabetes mellitus without complications: Secondary | ICD-10-CM

## 2013-09-08 DIAGNOSIS — M1990 Unspecified osteoarthritis, unspecified site: Secondary | ICD-10-CM

## 2013-09-08 DIAGNOSIS — Z6841 Body Mass Index (BMI) 40.0 and over, adult: Secondary | ICD-10-CM

## 2013-09-08 DIAGNOSIS — E875 Hyperkalemia: Secondary | ICD-10-CM

## 2013-09-08 DIAGNOSIS — Z713 Dietary counseling and surveillance: Secondary | ICD-10-CM

## 2013-09-08 DIAGNOSIS — F411 Generalized anxiety disorder: Secondary | ICD-10-CM

## 2013-09-08 DIAGNOSIS — E1149 Type 2 diabetes mellitus with other diabetic neurological complication: Secondary | ICD-10-CM

## 2013-09-08 DIAGNOSIS — E785 Hyperlipidemia, unspecified: Secondary | ICD-10-CM

## 2013-09-08 DIAGNOSIS — E1142 Type 2 diabetes mellitus with diabetic polyneuropathy: Secondary | ICD-10-CM

## 2013-09-08 DIAGNOSIS — M129 Arthropathy, unspecified: Secondary | ICD-10-CM

## 2013-09-08 LAB — POCT GLYCOSYLATED HEMOGLOBIN (HGB A1C): Hemoglobin A1C: 8.2

## 2013-09-08 NOTE — Progress Notes (Signed)
Subjective:    Patient ID: Kimberly Mcdowell, female    DOB: 12-03-1930, 80 y.o.   MRN: [id number]  Patient here today for follow up of chronic medical problems.  Hypertension This is a chronic problem. The current episode started more than 1 year ago. The problem is unchanged. The problem is uncontrolled (At home blood pressure runs in 812'X systolic.). Pertinent negatives include no blurred vision, malaise/fatigue or peripheral edema. There are no associated agents to hypertension. Risk factors for coronary artery disease include diabetes mellitus, dyslipidemia, obesity, post-menopausal state and sedentary lifestyle. Past treatments include diuretics, ACE inhibitors and beta blockers. The current treatment provides moderate improvement. Compliance problems include diet and exercise.   Hyperlipidemia This is a chronic problem. The current episode started more than 1 year ago. The problem is controlled. Recent lipid tests were reviewed and are normal. Exacerbating diseases include diabetes. She has no history of hypothyroidism or liver disease. There are no known factors aggravating her hyperlipidemia. Current antihyperlipidemic treatment includes statins. The current treatment provides moderate improvement of lipids. Compliance problems include adherence to diet and adherence to exercise.  Risk factors for coronary artery disease include diabetes mellitus, hypertension, obesity, post-menopausal and a sedentary lifestyle.  Diabetes She presents for her follow-up diabetic visit. She has type 2 diabetes mellitus. No MedicAlert identification noted. The initial diagnosis of diabetes was made 20 years ago. Her disease course has been stable. There are no hypoglycemic associated symptoms. Pertinent negatives for diabetes include no blurred vision, no polydipsia, no polyphagia and no weakness. There are no hypoglycemic complications. Symptoms are stable. There are no diabetic complications. Risk factors for  coronary artery disease include dyslipidemia, hypertension, obesity, post-menopausal and sedentary lifestyle. When asked about current treatments, none were reported. She is compliant with treatment most of the time. Her weight is fluctuating minimally. When asked about meal planning, she reported none. She has not had a previous visit with a dietician. She rarely participates in exercise. Her home blood glucose trend is fluctuating minimally. Her breakfast blood glucose is taken between 8-9 am. Her breakfast blood glucose range is generally 130-140 mg/dl. Her highest blood glucose is >200 mg/dl. An ACE inhibitor/angiotensin II receptor blocker is being taken. She does not see a podiatrist.Eye exam is current (May 2014).  depression Celexa- Keeps her calm and from worrying so much Diabetic neuropathy bil legs and feet Neurotin helps with discomfort- No medication side effects Hyperkalemia Have had a hard time getting potassiium down and have actually resorted to [facility] for a couple of days we will check levels today to see if better- patient denies chest pain or lower ext cramping. Review of Systems  Constitutional: Negative for malaise/fatigue.  Eyes: Negative for blurred vision.  Endocrine: Negative for polydipsia and polyphagia.  Neurological: Negative for weakness.  All other systems reviewed and are negative.      Objective:   Physical Exam  Constitutional: She is oriented to person, place, and time. She appears well-developed and well-nourished.  HENT:  Nose: Nose normal.  Mouth/Throat: Oropharynx is clear and moist.  Eyes: EOM are normal.  Neck: Trachea normal, normal range of motion and full passive range of motion without pain. Neck supple. No JVD present. Carotid bruit is not present. No thyromegaly present.  Cardiovascular: Normal rate, regular rhythm, normal heart sounds and intact distal pulses.  Exam reveals no gallop and no friction rub.   No murmur heard. Pulmonary/Chest:  Effort normal and breath sounds normal.  Abdominal: Soft. Bowel sounds are normal.  She exhibits no distension and no mass. There is no tenderness.  Musculoskeletal: Normal range of motion.  Lymphadenopathy:    She has no cervical adenopathy.  Neurological: She is alert and oriented to person, place, and time. She has normal reflexes.  Skin: Skin is warm and dry.  Psychiatric: She has a normal mood and affect. Her behavior is normal. Judgment and thought content normal.   BP 156/63  Pulse 62  Temp(Src) 97.2 F (36.2 C) (Oral)  Ht '5\' 4"'  (1.626 m)  Wt 247 lb (112.038 kg)  BMI 42.38 kg/m2        Assessment & Plan:    1. Type 2 diabetes mellitus without complication   2. Essential hypertension   3. Hyperlipidemia   4. Serum potassium elevated   5. GAD (generalized anxiety disorder)   6. Diabetic polyneuropathy associated with type 2 diabetes mellitus   7. Arthritis   8. BMI 40.0-44.9, adult   9. Weight loss counseling, encounter for    Orders Placed This Encounter  Procedures  . CMP14+EGFR  . NMR, lipoprofile  . Microalbumin, urine  . BMP8+EGFR  . POCT glycosylated hemoglobin (Hb A1C)     Labs pending Health maintenance reviewed Diet and exercise encouraged Continue all meds Follow up  In 3 months   [place], FNP

## 2013-09-08 NOTE — Patient Instructions (Signed)
Diabetes and Foot Care Diabetes may cause you to have problems because of poor blood supply (circulation) to your feet and legs. This may cause the skin on your feet to become thinner, break easier, and heal more slowly. Your skin may become dry, and the skin may peel and crack. You may also have nerve damage in your legs and feet causing decreased feeling in them. You may not notice minor injuries to your feet that could lead to infections or more serious problems. Taking care of your feet is one of the most important things you can do for yourself.  HOME CARE INSTRUCTIONS  Wear shoes at all times, even in the house. Do not go barefoot. Bare feet are easily injured.  Check your feet daily for blisters, cuts, and redness. If you cannot see the bottom of your feet, use a mirror or ask someone for help.  Wash your feet with warm water (do not use hot water) and mild soap. Then pat your feet and the areas between your toes until they are completely dry. Do not soak your feet as this can dry your skin.  Apply a moisturizing lotion or petroleum jelly (that does not contain alcohol and is unscented) to the skin on your feet and to dry, brittle toenails. Do not apply lotion between your toes.  Trim your toenails straight across. Do not dig under them or around the cuticle. File the edges of your nails with an emery board or nail file.  Do not cut corns or calluses or try to remove them with medicine.  Wear clean socks or stockings every day. Make sure they are not too tight. Do not wear knee-high stockings since they may decrease blood flow to your legs.  Wear shoes that fit properly and have enough cushioning. To break in new shoes, wear them for just a few hours a day. This prevents you from injuring your feet. Always look in your shoes before you put them on to be sure there are no objects inside.  Do not cross your legs. This may decrease the blood flow to your feet.  If you find a minor scrape,  cut, or break in the skin on your feet, keep it and the skin around it clean and dry. These areas may be cleansed with mild soap and water. Do not cleanse the area with peroxide, alcohol, or iodine.  When you remove an adhesive bandage, be sure not to damage the skin around it.  If you have a wound, look at it several times a day to make sure it is healing.  Do not use heating pads or hot water bottles. They may burn your skin. If you have lost feeling in your feet or legs, you may not know it is happening until it is too late.  Make sure your health care provider performs a complete foot exam at least annually or more often if you have foot problems. Report any cuts, sores, or bruises to your health care provider immediately. SEEK MEDICAL CARE IF:   You have an injury that is not healing.  You have cuts or breaks in the skin.  You have an ingrown nail.  You notice redness on your legs or feet.  You feel burning or tingling in your legs or feet.  You have pain or cramps in your legs and feet.  Your legs or feet are numb.  Your feet always feel cold. SEEK IMMEDIATE MEDICAL CARE IF:   There is increasing redness,   swelling, or pain in or around a wound.  There is a red line that goes up your leg.  Pus is coming from a wound.  You develop a fever or as directed by your health care provider.  You notice a bad smell coming from an ulcer or wound. Document Released: 01/07/2000 Document Revised: 09/11/2012 Document Reviewed: 06/18/2012 Exit[employer] Patient Information 2015 [employer]. This information is not intended to replace advice given to you by your health care provider. Make sure you discuss any questions you have with your health care provider.  

## 2013-09-09 ENCOUNTER — Other Ambulatory Visit (INDEPENDENT_AMBULATORY_CARE_PROVIDER_SITE_OTHER): Payer: Medicare HMO

## 2013-09-09 ENCOUNTER — Other Ambulatory Visit: Payer: Self-pay | Admitting: Nurse Practitioner

## 2013-09-09 DIAGNOSIS — R7989 Other specified abnormal findings of blood chemistry: Secondary | ICD-10-CM

## 2013-09-09 LAB — CMP14+EGFR
ALBUMIN: 4.1 g/dL (ref 3.5–4.7)
ALK PHOS: 82 IU/L (ref 39–117)
ALT: 23 IU/L (ref 0–32)
AST: 18 IU/L (ref 0–40)
Albumin/Globulin Ratio: 1.8 (ref 1.1–2.5)
BILIRUBIN TOTAL: 0.4 mg/dL (ref 0.0–1.2)
BUN / CREAT RATIO: 17 (ref 11–26)
BUN: 15 mg/dL (ref 8–27)
CO2: 26 mmol/L (ref 18–29)
Calcium: 9.3 mg/dL (ref 8.7–10.3)
Chloride: 103 mmol/L (ref 97–108)
Creatinine, Ser: 0.88 mg/dL (ref 0.57–1.00)
GFR calc non Af Amer: 62 mL/min/{1.73_m2} (ref >59)
GFR, EST AFRICAN AMERICAN: 72 mL/min/{1.73_m2} (ref >59)
GLUCOSE: 174 mg/dL — AB (ref 65–99)
Globulin, Total: 2.3 g/dL (ref 1.5–4.5)
POTASSIUM: 5.9 mmol/L — AB (ref 3.5–5.2)
Sodium: 143 mmol/L (ref 134–144)
TOTAL PROTEIN: 6.4 g/dL (ref 6.0–8.5)

## 2013-09-09 LAB — NMR, LIPOPROFILE
Cholesterol: 149 mg/dL (ref 100–199)
HDL Cholesterol by NMR: 52 mg/dL (ref >39)
HDL Particle Number: 34.6 umol/L (ref >=30.5)
LDL PARTICLE NUMBER: 1155 nmol/L — AB (ref <1000)
LDL Size: 20.6 nm (ref >20.5)
LDLC SERPL CALC-MCNC: 74 mg/dL (ref 0–99)
LP-IR Score: 66 — ABNORMAL HIGH (ref <=45)
Small LDL Particle Number: 656 nmol/L — ABNORMAL HIGH (ref <=527)
Triglycerides by NMR: 115 mg/dL (ref 0–149)

## 2013-09-09 LAB — MICROALBUMIN, URINE: Microalbumin, Urine: 13.5 ug/mL (ref 0.0–17.0)

## 2013-09-09 MED ORDER — KALEXATE PO POWD
15.0000 g | Freq: Every day | ORAL | Status: DC
Start: 2013-09-09 — End: 2014-08-12

## 2013-09-09 NOTE — Progress Notes (Signed)
Pt came in for repeat lab

## 2013-09-10 LAB — POTASSIUM: Potassium: 5.7 mmol/L — ABNORMAL HIGH (ref 3.5–5.2)

## 2013-09-11 ENCOUNTER — Other Ambulatory Visit (INDEPENDENT_AMBULATORY_CARE_PROVIDER_SITE_OTHER): Payer: Medicare HMO

## 2013-09-11 DIAGNOSIS — R7989 Other specified abnormal findings of blood chemistry: Secondary | ICD-10-CM

## 2013-09-11 LAB — BMP8+EGFR
BUN/Creatinine Ratio: 17 (ref 11–26)
BUN: 17 mg/dL (ref 8–27)
CO2: 27 mmol/L (ref 18–29)
Calcium: 9.4 mg/dL (ref 8.7–10.3)
Chloride: 102 mmol/L (ref 97–108)
Creatinine, Ser: 0.99 mg/dL (ref 0.57–1.00)
GFR calc Af Amer: 62 mL/min/{1.73_m2} (ref >59)
GFR calc non Af Amer: 54 mL/min/{1.73_m2} — ABNORMAL LOW (ref >59)
GLUCOSE: 187 mg/dL — AB (ref 65–99)
Potassium: 5.5 mmol/L — ABNORMAL HIGH (ref 3.5–5.2)
Sodium: 141 mmol/L (ref 134–144)

## 2013-09-16 ENCOUNTER — Telehealth: Payer: Self-pay | Admitting: *Deleted

## 2013-09-22 ENCOUNTER — Other Ambulatory Visit (INDEPENDENT_AMBULATORY_CARE_PROVIDER_SITE_OTHER): Payer: Medicare HMO

## 2013-09-22 DIAGNOSIS — R7989 Other specified abnormal findings of blood chemistry: Secondary | ICD-10-CM

## 2013-09-23 LAB — POTASSIUM: POTASSIUM: 4.9 mmol/L (ref 3.5–5.2)

## 2013-09-24 ENCOUNTER — Telehealth: Payer: Self-pay | Admitting: Family Medicine

## 2013-09-24 ENCOUNTER — Other Ambulatory Visit: Payer: Self-pay | Admitting: *Deleted

## 2013-09-24 MED ORDER — AMLODIPINE BESY-BENAZEPRIL HCL 2.5-10 MG PO CAPS: 1.0000 | ORAL_CAPSULE | Freq: Every day | ORAL | Status: DC

## 2013-09-24 NOTE — Telephone Encounter (Signed)
Message copied by Azalee Course on Wed Sep 24, 2013 12:23 PM ------      Message from: Bennie Pierini      Created: Tue Sep 23, 2013 11:55 AM       K+ normal       ------

## 2013-10-07 ENCOUNTER — Other Ambulatory Visit: Payer: Self-pay | Admitting: Nurse Practitioner

## 2013-11-04 ENCOUNTER — Other Ambulatory Visit: Payer: Self-pay | Admitting: *Deleted

## 2013-11-04 MED ORDER — METOPROLOL TARTRATE 50 MG PO TABS: ORAL_TABLET | ORAL | Status: DC

## 2013-11-07 ENCOUNTER — Other Ambulatory Visit: Payer: Self-pay | Admitting: Nurse Practitioner

## 2013-11-10 NOTE — Telephone Encounter (Signed)
Last seen 09/18/13 MMM  Requesting a 90 day supply

## 2013-12-12 ENCOUNTER — Other Ambulatory Visit: Payer: Self-pay | Admitting: Family Medicine

## 2014-01-28 ENCOUNTER — Encounter: Payer: Self-pay | Admitting: Nurse Practitioner

## 2014-01-28 ENCOUNTER — Ambulatory Visit (INDEPENDENT_AMBULATORY_CARE_PROVIDER_SITE_OTHER): Payer: Medicare HMO | Admitting: *Deleted

## 2014-01-28 ENCOUNTER — Ambulatory Visit (INDEPENDENT_AMBULATORY_CARE_PROVIDER_SITE_OTHER): Payer: Medicare HMO

## 2014-01-28 ENCOUNTER — Ambulatory Visit (INDEPENDENT_AMBULATORY_CARE_PROVIDER_SITE_OTHER): Payer: Medicare HMO | Admitting: Nurse Practitioner

## 2014-01-28 VITALS — BP 136/82 | HR 60 | Temp 97.5°F | Ht 64.0 in | Wt 242.0 lb

## 2014-01-28 DIAGNOSIS — Z23 Encounter for immunization: Secondary | ICD-10-CM

## 2014-01-28 DIAGNOSIS — E785 Hyperlipidemia, unspecified: Secondary | ICD-10-CM

## 2014-01-28 DIAGNOSIS — Z87891 Personal history of nicotine dependence: Secondary | ICD-10-CM

## 2014-01-28 DIAGNOSIS — I10 Essential (primary) hypertension: Secondary | ICD-10-CM

## 2014-01-28 DIAGNOSIS — F411 Generalized anxiety disorder: Secondary | ICD-10-CM

## 2014-01-28 DIAGNOSIS — E1142 Type 2 diabetes mellitus with diabetic polyneuropathy: Secondary | ICD-10-CM

## 2014-01-28 DIAGNOSIS — E119 Type 2 diabetes mellitus without complications: Secondary | ICD-10-CM

## 2014-01-28 DIAGNOSIS — R609 Edema, unspecified: Secondary | ICD-10-CM

## 2014-01-28 DIAGNOSIS — Z720 Tobacco use: Secondary | ICD-10-CM

## 2014-01-28 DIAGNOSIS — R600 Localized edema: Secondary | ICD-10-CM | POA: Insufficient documentation

## 2014-01-28 LAB — POCT GLYCOSYLATED HEMOGLOBIN (HGB A1C): Hemoglobin A1C: 7.5

## 2014-01-28 MED ORDER — METFORMIN HCL 1000 MG PO TABS: 1000.0000 mg | ORAL_TABLET | Freq: Two times a day (BID) | ORAL | Status: DC

## 2014-01-28 MED ORDER — METOPROLOL TARTRATE 50 MG PO TABS: ORAL_TABLET | ORAL | Status: DC

## 2014-01-28 MED ORDER — GABAPENTIN 100 MG PO CAPS: 100.0000 mg | ORAL_CAPSULE | Freq: Three times a day (TID) | ORAL | Status: DC

## 2014-01-28 MED ORDER — FUROSEMIDE 40 MG PO TABS: 40.0000 mg | ORAL_TABLET | Freq: Every day | ORAL | Status: DC

## 2014-01-28 MED ORDER — CITALOPRAM HYDROBROMIDE 20 MG PO TABS: 20.0000 mg | ORAL_TABLET | Freq: Every day | ORAL | Status: DC

## 2014-01-28 MED ORDER — GLIPIZIDE ER 2.5 MG PO TB24: 2.5000 mg | ORAL_TABLET | Freq: Every day | ORAL | Status: DC

## 2014-01-28 MED ORDER — AMLODIPINE BESY-BENAZEPRIL HCL 2.5-10 MG PO CAPS: 1.0000 | ORAL_CAPSULE | Freq: Every day | ORAL | Status: DC

## 2014-01-28 NOTE — Progress Notes (Signed)
Subjective:    Patient ID: Kimberly Mcdowell, female    DOB: [birth year], 81 y.o.   MRN: [id number]  Patient here today for follow up of chronic medical problems.  Hypertension This is a chronic problem. The current episode started more than 1 year ago. The problem has been waxing and waning since onset. Risk factors for coronary artery disease include diabetes mellitus, dyslipidemia, obesity, post-menopausal state and sedentary lifestyle. Past treatments include calcium channel blockers and ACE inhibitors. The current treatment provides moderate improvement. Compliance problems include diet and exercise.  Hypertensive end-organ damage includes CAD/MI.  Hyperlipidemia This is a chronic problem. The current episode started more than 1 year ago. The problem is controlled. Recent lipid tests were reviewed and are normal. Exacerbating diseases include diabetes and obesity. She has no history of hypothyroidism. Current antihyperlipidemic treatment includes statins. The current treatment provides moderate improvement of lipids. Compliance problems include adherence to diet and adherence to exercise.  Risk factors for coronary artery disease include diabetes mellitus, hypertension, obesity, post-menopausal and a sedentary lifestyle.  Diabetes She presents for her follow-up diabetic visit. She has type 2 diabetes mellitus. No MedicAlert identification noted. Her disease course has been fluctuating. There are no hypoglycemic associated symptoms. Pertinent negatives for diabetes include no polydipsia, no polyphagia and no weakness. There are no hypoglycemic complications. Symptoms are stable. There are no diabetic complications. Risk factors for coronary artery disease include diabetes mellitus, dyslipidemia, hypertension, obesity and post-menopausal. Current diabetic treatment includes oral agent (dual therapy). She is compliant with treatment most of the time. Her weight is stable. When asked about meal planning,  she reported none. She has not had a previous visit with a dietitian. She rarely participates in exercise. There is no change in her home blood glucose trend. Her breakfast blood glucose is taken between 9-10 am. Her breakfast blood glucose range is generally 140-180 mg/dl. Her overall blood glucose range is 140-180 mg/dl. An ACE inhibitor/angiotensin II receptor blocker is being taken. She does not see a podiatrist.Eye exam is not current.  depression Celexa- Keeps her calm and from worrying so much Diabetic neuropathy bil legs and feet Neurotin helps with discomfort- No medication side effects Hyperkalemia Have had a hard time getting potassiium down and have actually resorted to [facility] for a couple of days we will check levels today to see if better- patient denies chest pain or lower ext cramping. Review of Systems  Constitutional: Negative.   HENT: Negative.   Respiratory: Negative.   Cardiovascular: Negative.   Gastrointestinal: Negative.   Endocrine: Negative for polydipsia and polyphagia.  Neurological: Negative.  Negative for weakness.  Psychiatric/Behavioral: Negative.   All other systems reviewed and are negative.      Objective:   Physical Exam  Constitutional: She is oriented to person, place, and time. She appears well-developed and well-nourished.  HENT:  Nose: Nose normal.  Mouth/Throat: Oropharynx is clear and moist.  Eyes: EOM are normal.  Neck: Trachea normal, normal range of motion and full passive range of motion without pain. Neck supple. No JVD present. Carotid bruit is not present. No thyromegaly present.  Cardiovascular: Normal rate, regular rhythm, normal heart sounds and intact distal pulses.  Exam reveals no gallop and no friction rub.   No murmur heard. Pulmonary/Chest: Effort normal and breath sounds normal.  Abdominal: Soft. Bowel sounds are normal. She exhibits no distension and no mass. There is no tenderness.  Musculoskeletal: Normal range of  motion.  Lymphadenopathy:    She has no  cervical adenopathy.  Neurological: She is alert and oriented to person, place, and time. She has normal reflexes.  Skin: Skin is warm and dry.  Psychiatric: She has a normal mood and affect. Her behavior is normal. Judgment and thought content normal.    BP 136/82 mmHg  Pulse 60  Temp(Src) 97.5 F (36.4 C) (Oral)  Ht _0  (1.626 m)  Wt 242 lb (109.77 kg)  BMI 41.52 kg/m2  Results for orders placed or performed in visit on 01/28/14  POCT glycosylated hemoglobin (Hb A1C)  Result Value Ref Range   Hemoglobin A1C 7.5%    Chest x ray- no acute findings- mild chronic bronchitis changes-Preliminary reading by Ronnald Collum, FNP  [facility]  EKG- Atrial rhythym- Mary-Margaret Hassell Done, FNP        Assessment & Plan:    1. Essential hypertension Do not add salt to diet - CMP14+EGFR - EKG 12-Lead - amlodipine-benazepril (LOTREL) 2.5-10 MG per capsule; Take 1 capsule by mouth daily.  Dispense: 90 capsule; Refill: 1 - metoprolol (LOPRESSOR) 50 MG tablet; TAKE ONE TABLET BY MOUTH TWICE DAILY  Dispense: 180 tablet; Refill: 1  2. Type 2 diabetes mellitus without complication [id number] inproving- little stricter on carbs in diet - POCT glycosylated hemoglobin (Hb A1C) - glipiZIDE (GLIPIZIDE XL) 2.5 MG 24 hr tablet; Take 1 tablet (2.5 mg total) by mouth daily.  Dispense: 90 tablet; Refill: 1 - metFORMIN (GLUCOPHAGE) 1000 MG tablet; Take 1 tablet (1,000 mg total) by mouth 2 (two) times daily with a meal.  Dispense: 180 tablet; Refill: 1  3. Diabetic polyneuropathy associated with type 2 diabetes mellitus - gabapentin (NEURONTIN) 100 MG capsule; Take 1 capsule (100 mg total) by mouth 3 (three) times daily.  Dispense: 270 capsule; Refill: 1  4. GAD (generalized anxiety disorder) Stress amanegement - citalopram (CELEXA) 20 MG tablet; Take 1 tablet (20 mg total) by mouth daily.  Dispense: 90 tablet; Refill: 1  5. Hyperlipidemia Low fat diet - NMR,  lipoprofile  6. Smoking hx - DG Chest 2 View; Future  7. Peripheral edema elevat legs when swell - furosemide (LASIX) 40 MG tablet; Take 1 tablet (40 mg total) by mouth daily.  Dispense: 90 tablet; Refill: 1    Labs pending Health maintenance reviewed Diet and exercise encouraged Continue all meds Follow up  In 3 months   [place], FNP

## 2014-01-28 NOTE — Patient Instructions (Signed)

## 2014-01-29 LAB — NMR, LIPOPROFILE
Cholesterol: 214 mg/dL — ABNORMAL HIGH (ref 100–199)
HDL Cholesterol by NMR: 53 mg/dL (ref >39)
HDL Particle Number: 31.1 umol/L (ref >=30.5)
LDL PARTICLE NUMBER: 1781 nmol/L — AB (ref <1000)
LDL Size: 21.4 nm (ref >20.5)
LDL-C: 137 mg/dL — ABNORMAL HIGH (ref 0–99)
LP-IR Score: 36 (ref <=45)
Small LDL Particle Number: 630 nmol/L — ABNORMAL HIGH (ref <=527)
TRIGLYCERIDES BY NMR: 122 mg/dL (ref 0–149)

## 2014-01-29 LAB — CMP14+EGFR
ALT: 18 IU/L (ref 0–32)
AST: 15 IU/L (ref 0–40)
Albumin/Globulin Ratio: 1.6 (ref 1.1–2.5)
Albumin: 4.1 g/dL (ref 3.5–4.7)
Alkaline Phosphatase: 95 IU/L (ref 39–117)
BUN / CREAT RATIO: 14 (ref 11–26)
BUN: 13 mg/dL (ref 8–27)
CO2: 27 mmol/L (ref 18–29)
CREATININE: 0.92 mg/dL (ref 0.57–1.00)
Calcium: 9.5 mg/dL (ref 8.7–10.3)
Chloride: 101 mmol/L (ref 97–108)
GFR calc Af Amer: 68 mL/min/{1.73_m2} (ref >59)
GFR calc non Af Amer: 59 mL/min/{1.73_m2} — ABNORMAL LOW (ref >59)
Globulin, Total: 2.5 g/dL (ref 1.5–4.5)
Glucose: 142 mg/dL — ABNORMAL HIGH (ref 65–99)
POTASSIUM: 5.3 mmol/L — AB (ref 3.5–5.2)
SODIUM: 142 mmol/L (ref 134–144)
Total Bilirubin: 0.4 mg/dL (ref 0.0–1.2)
Total Protein: 6.6 g/dL (ref 6.0–8.5)

## 2014-02-03 ENCOUNTER — Telehealth: Payer: Self-pay | Admitting: *Deleted

## 2014-02-03 NOTE — Telephone Encounter (Signed)
-----   Message from [place] SpringsMary-Margaret Martin, FNP sent at 01/31/2014  9:18 AM EST ----- Not something she  can take everyday but i will see what we can come up with

## 2014-02-03 NOTE — Telephone Encounter (Signed)
Pt.notified

## 2014-04-29 ENCOUNTER — Ambulatory Visit (INDEPENDENT_AMBULATORY_CARE_PROVIDER_SITE_OTHER): Payer: Medicare HMO | Admitting: Nurse Practitioner

## 2014-04-29 ENCOUNTER — Encounter: Payer: Self-pay | Admitting: Nurse Practitioner

## 2014-04-29 VITALS — BP 142/64 | HR 65 | Temp 97.4°F | Ht 64.0 in | Wt 237.0 lb

## 2014-04-29 DIAGNOSIS — E119 Type 2 diabetes mellitus without complications: Secondary | ICD-10-CM | POA: Diagnosis not present

## 2014-04-29 DIAGNOSIS — F32A Depression, unspecified: Secondary | ICD-10-CM

## 2014-04-29 DIAGNOSIS — I10 Essential (primary) hypertension: Secondary | ICD-10-CM

## 2014-04-29 DIAGNOSIS — F411 Generalized anxiety disorder: Secondary | ICD-10-CM | POA: Diagnosis not present

## 2014-04-29 DIAGNOSIS — E1142 Type 2 diabetes mellitus with diabetic polyneuropathy: Secondary | ICD-10-CM

## 2014-04-29 DIAGNOSIS — Z23 Encounter for immunization: Secondary | ICD-10-CM | POA: Diagnosis not present

## 2014-04-29 DIAGNOSIS — R609 Edema, unspecified: Secondary | ICD-10-CM | POA: Diagnosis not present

## 2014-04-29 DIAGNOSIS — E785 Hyperlipidemia, unspecified: Secondary | ICD-10-CM

## 2014-04-29 DIAGNOSIS — F329 Major depressive disorder, single episode, unspecified: Secondary | ICD-10-CM | POA: Diagnosis not present

## 2014-04-29 LAB — POCT GLYCOSYLATED HEMOGLOBIN (HGB A1C)

## 2014-04-29 MED ORDER — FUROSEMIDE 40 MG PO TABS: 40.0000 mg | ORAL_TABLET | Freq: Every day | ORAL | Status: DC

## 2014-04-29 MED ORDER — METFORMIN HCL 1000 MG PO TABS: 1000.0000 mg | ORAL_TABLET | Freq: Two times a day (BID) | ORAL | Status: DC

## 2014-04-29 MED ORDER — GABAPENTIN 100 MG PO CAPS
100.0000 mg | ORAL_CAPSULE | Freq: Three times a day (TID) | ORAL | Status: DC
Start: 2014-04-29 — End: 2014-09-23

## 2014-04-29 MED ORDER — ATORVASTATIN CALCIUM 40 MG PO TABS: 40.0000 mg | ORAL_TABLET | Freq: Every day | ORAL | Status: DC

## 2014-04-29 MED ORDER — GLIPIZIDE ER 2.5 MG PO TB24: 2.5000 mg | ORAL_TABLET | Freq: Every day | ORAL | Status: DC

## 2014-04-29 MED ORDER — AMLODIPINE BESY-BENAZEPRIL HCL 2.5-10 MG PO CAPS: 1.0000 | ORAL_CAPSULE | Freq: Every day | ORAL | Status: DC

## 2014-04-29 MED ORDER — METOPROLOL TARTRATE 50 MG PO TABS: ORAL_TABLET | ORAL | Status: DC

## 2014-04-29 MED ORDER — CITALOPRAM HYDROBROMIDE 40 MG PO TABS: 40.0000 mg | ORAL_TABLET | Freq: Every day | ORAL | Status: DC

## 2014-04-29 NOTE — Progress Notes (Signed)
Subjective:    Patient ID: Kimberly Mcdowell, female    DOB: [birth year], 81 y.o.   MRN: [id number]  Patient here today for follow up of chronic medical problems. She reports feeling "depressed" and feels like she is having mood swings. She feels unhappy in her church and states she does not enjoy food anymore, reports she does not enjoy many activities anymore and frequently tires of her friends and family.  She reports she takes her blood sugar twice a day and her blood sugars have been running 120s-140s consistently.   Hypertension This is a chronic problem. The current episode started more than 1 year ago. The problem has been waxing and waning since onset. Pertinent negatives include no chest pain, orthopnea, palpitations or shortness of breath. Risk factors for coronary artery disease include diabetes mellitus, dyslipidemia, obesity, post-menopausal state and sedentary lifestyle. Past treatments include calcium channel blockers and ACE inhibitors. The current treatment provides moderate improvement. Compliance problems include diet and exercise.  Hypertensive end-organ damage includes CAD/MI.  Hyperlipidemia This is a chronic problem. The current episode started more than 1 year ago. The problem is controlled. Recent lipid tests were reviewed and are normal. Exacerbating diseases include diabetes and obesity. She has no history of hypothyroidism. Pertinent negatives include no chest pain or shortness of breath. Current antihyperlipidemic treatment includes statins. The current treatment provides moderate improvement of lipids. Compliance problems include adherence to diet and adherence to exercise.  Risk factors for coronary artery disease include diabetes mellitus, hypertension, obesity, post-menopausal and a sedentary lifestyle.  Diabetes She presents for her follow-up diabetic visit. She has type 2 diabetes mellitus. No MedicAlert identification noted. Her disease course has been fluctuating.  There are no hypoglycemic associated symptoms. Associated symptoms include foot paresthesias (Bilateral feet-burning sensation to plantar surface. ). Pertinent negatives for diabetes include no chest pain, no fatigue, no polydipsia, no polyphagia and no weakness. There are no hypoglycemic complications. Symptoms are stable. Diabetic complications include peripheral neuropathy. Risk factors for coronary artery disease include diabetes mellitus, dyslipidemia, hypertension, obesity and post-menopausal. Current diabetic treatment includes oral agent (dual therapy). She is compliant with treatment most of the time. Her weight is stable. When asked about meal planning, she reported none. She has not had a previous visit with a dietitian. She rarely participates in exercise. There is no change in her home blood glucose trend. Her breakfast blood glucose is taken between 9-10 am. Her breakfast blood glucose range is generally 130-140 mg/dl. Her overall blood glucose range is 130-140 mg/dl. An ACE inhibitor/angiotensin II receptor blocker is being taken. She does not see a podiatrist.Eye exam is not current.  depression Celexa- Keeps her calm and from worrying so much Diabetic neuropathy bil legs and feet Neurotin helps with discomfort- No medication side effects Hyperkalemia Have had a hard time getting potassiium down and have actually resorted to [facility] for a couple of days we will check levels today to see if better- patient denies chest pain or lower ext cramping. Review of Systems  Constitutional: Positive for activity change (Decreased motivation) and appetite change (Decreased appetite). Negative for fatigue and unexpected weight change.  HENT: Negative.   Eyes: Negative.   Respiratory: Negative.  Negative for cough, chest tightness, shortness of breath and wheezing.   Cardiovascular: Negative.  Negative for chest pain, palpitations, orthopnea and leg swelling.  Gastrointestinal: Negative.    Endocrine: Negative for polydipsia and polyphagia.  Musculoskeletal: Positive for arthralgias (In bilateral hands and ankles. ).  Neurological: Negative.  Negative for weakness.  Psychiatric/Behavioral: Negative for suicidal ideas and self-injury.  All other systems reviewed and are negative.      Objective:   Physical Exam  Constitutional: She is oriented to person, place, and time. She appears well-developed and well-nourished.  HENT:  Nose: Nose normal.  Mouth/Throat: Oropharynx is clear and moist.  Eyes: EOM are normal.  Neck: Trachea normal, normal range of motion and full passive range of motion without pain. Neck supple. No JVD present. Carotid bruit is not present. No thyromegaly present.  Cardiovascular: Normal rate, regular rhythm, normal heart sounds and intact distal pulses.  Exam reveals no gallop and no friction rub.   No murmur heard. Pulmonary/Chest: Effort normal and breath sounds normal.  Abdominal: Soft. Bowel sounds are normal. She exhibits no distension and no mass. There is no tenderness.  Musculoskeletal: Normal range of motion.  Lymphadenopathy:    She has no cervical adenopathy.  Neurological: She is alert and oriented to person, place, and time. She has normal reflexes.  Skin: Skin is warm and dry.  Psychiatric: She has a normal mood and affect. Her behavior is normal. Judgment and thought content normal.    BP 142/64 mmHg  Pulse 65  Temp(Src) 97.4 F (36.3 C) (Oral)  Ht '5\' 4"'  (1.626 m)  Wt 237 lb (107.502 kg)  BMI 40.66 kg/m2  Results for orders placed or performed in visit on 04/29/14  POCT glycosylated hemoglobin (Hb A1C)  Result Value Ref Range   Hemoglobin A1C 6.9%           Assessment & Plan:    1. Essential hypertension Do not add salt to diet - CMP14+EGFR - EKG 12-Lead - amlodipine-benazepril (LOTREL) 2.5-10 MG per capsule; Take 1 capsule by mouth daily.  Dispense: 90 capsule; Refill: 1 - metoprolol (LOPRESSOR) 50 MG tablet;  TAKE ONE TABLET BY MOUTH TWICE DAILY  Dispense: 180 tablet; Refill: 1  2. Type 2 diabetes mellitus without complication [id number] inproving- little stricter on carbs in diet - POCT glycosylated hemoglobin (Hb A1C) - glipiZIDE (GLIPIZIDE XL) 2.5 MG 24 hr tablet; Take 1 tablet (2.5 mg total) by mouth daily.  Dispense: 90 tablet; Refill: 1 - metFORMIN (GLUCOPHAGE) 1000 MG tablet; Take 1 tablet (1,000 mg total) by mouth 2 (two) times daily with a meal.  Dispense: 180 tablet; Refill: 1  3. Diabetic polyneuropathy associated with type 2 diabetes mellitus - gabapentin (NEURONTIN) 100 MG capsule; Take 1 capsule (100 mg total) by mouth 3 (three) times daily.  Dispense: 270 capsule; Refill: 1  4. GAD (generalized anxiety disorder) Stress management Increased celexa to 40 mg today - citalopram (CELEXA) 40 MG tablet; Take 1 tablet (20 mg total) by mouth daily.  Dispense: 90 tablet; Refill: 1  5. Hyperlipidemia Low fat diet - NMR, lipoprofile  6. Smoking hx - DG Chest 2 View; Future  7. Peripheral edema elevat legs when swell - furosemide (LASIX) 40 MG tablet; Take 1 tablet (40 mg total) by mouth daily.  Dispense: 90 tablet; Refill: 1   Encouraged to get eye exam Labs pending Health maintenance reviewed Diet and exercise encouraged Continue all meds Follow up  In 3 months   [place], FNP

## 2014-04-29 NOTE — Addendum Note (Signed)
Addended by: Cleda DaubUCKER, Makayla Confer G on: 04/29/2014 10:48 AM   Modules accepted: Orders

## 2014-04-30 LAB — CMP14+EGFR
A/G RATIO: 1.4 (ref 1.1–2.5)
ALT: 19 IU/L (ref 0–32)
AST: 13 IU/L (ref 0–40)
Albumin: 4 g/dL (ref 3.5–4.7)
Alkaline Phosphatase: 81 IU/L (ref 39–117)
BUN / CREAT RATIO: 14 (ref 11–26)
BUN: 13 mg/dL (ref 8–27)
Bilirubin Total: 0.3 mg/dL (ref 0.0–1.2)
CALCIUM: 9.2 mg/dL (ref 8.7–10.3)
CHLORIDE: 102 mmol/L (ref 97–108)
CO2: 23 mmol/L (ref 18–29)
Creatinine, Ser: 0.96 mg/dL (ref 0.57–1.00)
GFR calc Af Amer: 64 mL/min/{1.73_m2} (ref >59)
GFR calc non Af Amer: 56 mL/min/{1.73_m2} — ABNORMAL LOW (ref >59)
GLOBULIN, TOTAL: 2.8 g/dL (ref 1.5–4.5)
GLUCOSE: 153 mg/dL — AB (ref 65–99)
POTASSIUM: 5.9 mmol/L — AB (ref 3.5–5.2)
SODIUM: 144 mmol/L (ref 134–144)
TOTAL PROTEIN: 6.8 g/dL (ref 6.0–8.5)

## 2014-04-30 LAB — NMR, LIPOPROFILE
Cholesterol: 220 mg/dL — ABNORMAL HIGH (ref 100–199)
HDL Cholesterol by NMR: 55 mg/dL (ref >39)
HDL Particle Number: 31.7 umol/L (ref >=30.5)
LDL PARTICLE NUMBER: 1885 nmol/L — AB (ref <1000)
LDL SIZE: 21.2 nm (ref >20.5)
LDL-C: 134 mg/dL — ABNORMAL HIGH (ref 0–99)
LP-IR SCORE: 44 (ref <=45)
Small LDL Particle Number: 843 nmol/L — ABNORMAL HIGH (ref <=527)
TRIGLYCERIDES BY NMR: 154 mg/dL — AB (ref 0–149)

## 2014-05-01 ENCOUNTER — Telehealth: Payer: Self-pay | Admitting: *Deleted

## 2014-05-01 NOTE — Telephone Encounter (Signed)
-----   Message from [facility], FNP sent at 05/01/2014 10:01 AM EDT ----- Hgba1c discussed at appointment Kidney and liver function stable K elevated- patient to have repeated LDL partilce numbers elevated as well as ldl- patient  On lipitor- make sure take daily Continue current meds- low fat diet and exercise and recheck in 3 months

## 2014-05-04 ENCOUNTER — Other Ambulatory Visit (INDEPENDENT_AMBULATORY_CARE_PROVIDER_SITE_OTHER): Payer: Medicare HMO

## 2014-05-04 DIAGNOSIS — E875 Hyperkalemia: Secondary | ICD-10-CM | POA: Diagnosis not present

## 2014-05-04 NOTE — Progress Notes (Signed)
Lab only 

## 2014-05-05 LAB — POTASSIUM: Potassium: 5.1 mmol/L (ref 3.5–5.2)

## 2014-08-12 ENCOUNTER — Ambulatory Visit (INDEPENDENT_AMBULATORY_CARE_PROVIDER_SITE_OTHER): Payer: Medicare HMO | Admitting: Nurse Practitioner

## 2014-08-12 ENCOUNTER — Encounter: Payer: Self-pay | Admitting: Nurse Practitioner

## 2014-08-12 VITALS — BP 142/68 | HR 63 | Temp 96.7°F | Ht 64.0 in | Wt 234.0 lb

## 2014-08-12 DIAGNOSIS — E119 Type 2 diabetes mellitus without complications: Secondary | ICD-10-CM | POA: Diagnosis not present

## 2014-08-12 DIAGNOSIS — R609 Edema, unspecified: Secondary | ICD-10-CM | POA: Diagnosis not present

## 2014-08-12 DIAGNOSIS — F329 Major depressive disorder, single episode, unspecified: Secondary | ICD-10-CM | POA: Diagnosis not present

## 2014-08-12 DIAGNOSIS — E1142 Type 2 diabetes mellitus with diabetic polyneuropathy: Secondary | ICD-10-CM

## 2014-08-12 DIAGNOSIS — F411 Generalized anxiety disorder: Secondary | ICD-10-CM | POA: Diagnosis not present

## 2014-08-12 DIAGNOSIS — E785 Hyperlipidemia, unspecified: Secondary | ICD-10-CM | POA: Diagnosis not present

## 2014-08-12 DIAGNOSIS — I10 Essential (primary) hypertension: Secondary | ICD-10-CM

## 2014-08-12 DIAGNOSIS — F32A Depression, unspecified: Secondary | ICD-10-CM

## 2014-08-12 LAB — POCT GLYCOSYLATED HEMOGLOBIN (HGB A1C): HEMOGLOBIN A1C: 7.1

## 2014-08-12 NOTE — Patient Instructions (Signed)

## 2014-08-12 NOTE — Progress Notes (Signed)
Subjective:    Patient ID: Kimberly Mcdowell, female    DOB: January 17, 1931, 81 y.o.   MRN: [id number]  Patient here today for follow up of chronic medical problems. SH eis doing quite well today- says that she has been getting out of the house more and that has helped with her anxiety.   Hypertension This is a chronic problem. The current episode started more than 1 year ago. The problem has been waxing and waning since onset. Pertinent negatives include no chest pain, orthopnea, palpitations or shortness of breath. Risk factors for coronary artery disease include diabetes mellitus, dyslipidemia, obesity, post-menopausal state and sedentary lifestyle. Past treatments include calcium channel blockers and ACE inhibitors. The current treatment provides moderate improvement. Compliance problems include diet and exercise.  Hypertensive end-organ damage includes CAD/MI.  Hyperlipidemia This is a chronic problem. The current episode started more than 1 year ago. The problem is controlled. Recent lipid tests were reviewed and are normal. Exacerbating diseases include diabetes and obesity. She has no history of hypothyroidism. Pertinent negatives include no chest pain or shortness of breath. Current antihyperlipidemic treatment includes statins. The current treatment provides moderate improvement of lipids. Compliance problems include adherence to diet and adherence to exercise.  Risk factors for coronary artery disease include diabetes mellitus, hypertension, obesity, post-menopausal and a sedentary lifestyle.  Diabetes She presents for her follow-up diabetic visit. She has type 2 diabetes mellitus. No MedicAlert identification noted. Her disease course has been fluctuating. There are no hypoglycemic associated symptoms. Associated symptoms include foot paresthesias (Bilateral feet-burning sensation to plantar surface. ). Pertinent negatives for diabetes include no chest pain, no fatigue, no polydipsia, no polyphagia  and no weakness. There are no hypoglycemic complications. Symptoms are stable. Diabetic complications include peripheral neuropathy. Risk factors for coronary artery disease include diabetes mellitus, dyslipidemia, hypertension, obesity and post-menopausal. Current diabetic treatment includes oral agent (dual therapy). She is compliant with treatment most of the time. Her weight is stable. When asked about meal planning, she reported none. She has not had a previous visit with a dietitian. She rarely participates in exercise. There is no change in her home blood glucose trend. Her breakfast blood glucose is taken between 9-10 am. Her breakfast blood glucose range is generally 130-140 mg/dl. Her overall blood glucose range is 130-140 mg/dl. An ACE inhibitor/angiotensin II receptor blocker is being taken. She does not see a podiatrist.Eye exam is not current.  depression Celexa- Keeps her calm and from worrying so much Diabetic neuropathy bil legs and feet Neurotin helps with discomfort- No medication side effects Hyperkalemia Still having  a hard time getting potassiium down and have actually resorted to [facility] for a couple of days we will check levels today to see if better- patient denies chest pain or lower ext cramping.   Review of Systems  Constitutional: Positive for activity change (activity level has actually increased) and appetite change. Negative for fatigue and unexpected weight change.  HENT: Negative.   Eyes: Negative.   Respiratory: Negative.  Negative for cough, chest tightness, shortness of breath and wheezing.   Cardiovascular: Negative.  Negative for chest pain, palpitations, orthopnea and leg swelling.  Gastrointestinal: Negative.   Endocrine: Negative for polydipsia and polyphagia.  Genitourinary: Negative.   Musculoskeletal: Positive for arthralgias (In bilateral hands and ankles. ).  Neurological: Negative.  Negative for weakness.  Psychiatric/Behavioral: Negative.   Negative for suicidal ideas and self-injury.  All other systems reviewed and are negative.      Objective:   Physical  Exam  Constitutional: She is oriented to person, place, and time. She appears well-developed and well-nourished.  HENT:  Nose: Nose normal.  Mouth/Throat: Oropharynx is clear and moist.  Bilateral cerumen impaction- irrigated out without problems  Eyes: EOM are normal.  Neck: Trachea normal, normal range of motion and full passive range of motion without pain. Neck supple. No JVD present. Carotid bruit is not present. No thyromegaly present.  Cardiovascular: Normal rate, regular rhythm, normal heart sounds and intact distal pulses.  Exam reveals no gallop and no friction rub.   No murmur heard. Pulmonary/Chest: Effort normal and breath sounds normal.  Abdominal: Soft. Bowel sounds are normal. She exhibits no distension and no mass. There is no tenderness.  Musculoskeletal: Normal range of motion.  Lymphadenopathy:    She has no cervical adenopathy.  Neurological: She is alert and oriented to person, place, and time. She has normal reflexes.  Skin: Skin is warm and dry.  Psychiatric: She has a normal mood and affect. Her behavior is normal. Judgment and thought content normal.    BP 142/68 mmHg  Pulse 63  Temp(Src) 96.7 F (35.9 C) (Oral)  Ht '5\' 4"'  (1.626 m)  Wt 234 lb (106.142 kg)  BMI 40.15 kg/m2  Results for orders placed or performed in visit on 08/12/14  POCT glycosylated hemoglobin (Hb A1C)  Result Value Ref Range   Hemoglobin A1C 7.1       Assessment & Plan:   1. Essential hypertension Dash diet - CMP14+EGFR  2. Type 2 diabetes mellitus without complication Continue to watch carbs - POCT glycosylated hemoglobin (Hb A1C)  3. Hyperlipemia Low fat diet - Lipid panel  4. Diabetic polyneuropathy associated with type 2 diabetes mellitus Do not go barefooted Soak feet daily  5. Peripheral edema Elevate legs when sitting  6.  Hyperlipidemia Low fat diet   7. GAD (generalized anxiety disorder) Stress management  8. Depression  9. BMI >40 Discussed diet and exercise for person with BMI >25 Will recheck weight in 3-6 months    Labs pending Health maintenance reviewed Diet and exercise encouraged Continue all meds Follow up  In 3 month   [place], FNP

## 2014-08-13 ENCOUNTER — Telehealth: Payer: Self-pay | Admitting: *Deleted

## 2014-08-13 DIAGNOSIS — E875 Hyperkalemia: Secondary | ICD-10-CM

## 2014-08-13 LAB — CMP14+EGFR
A/G RATIO: 1.7 (ref 1.1–2.5)
ALK PHOS: 79 IU/L (ref 39–117)
ALT: 37 IU/L — ABNORMAL HIGH (ref 0–32)
AST: 26 IU/L (ref 0–40)
Albumin: 4.2 g/dL (ref 3.5–4.7)
BILIRUBIN TOTAL: 0.4 mg/dL (ref 0.0–1.2)
BUN / CREAT RATIO: 16 (ref 11–26)
BUN: 16 mg/dL (ref 8–27)
CALCIUM: 9.4 mg/dL (ref 8.7–10.3)
CO2: 26 mmol/L (ref 18–29)
CREATININE: 1.01 mg/dL — AB (ref 0.57–1.00)
Chloride: 101 mmol/L (ref 97–108)
GFR calc Af Amer: 60 mL/min/{1.73_m2} (ref >59)
GFR calc non Af Amer: 52 mL/min/{1.73_m2} — ABNORMAL LOW (ref >59)
GLOBULIN, TOTAL: 2.5 g/dL (ref 1.5–4.5)
Glucose: 153 mg/dL — ABNORMAL HIGH (ref 65–99)
Potassium: 6 mmol/L (ref 3.5–5.2)
Sodium: 141 mmol/L (ref 134–144)
Total Protein: 6.7 g/dL (ref 6.0–8.5)

## 2014-08-13 LAB — LIPID PANEL
CHOL/HDL RATIO: 4.6 ratio — AB (ref 0.0–4.4)
Cholesterol, Total: 223 mg/dL — ABNORMAL HIGH (ref 100–199)
HDL: 49 mg/dL (ref >39)
LDL Calculated: 142 mg/dL — ABNORMAL HIGH (ref 0–99)
Triglycerides: 159 mg/dL — ABNORMAL HIGH (ref 0–149)
VLDL Cholesterol Cal: 32 mg/dL (ref 5–40)

## 2014-08-13 NOTE — Telephone Encounter (Signed)
-----   Message from [facility], FNP sent at 08/13/2014  8:53 AM EDT ----- Hgba1c discussed at appointment Kidney and liver function stable K elevated- needs to be repeated ASAP Trig and ldl are elevated Continue current meds- low fat diet and exercise and recheck in 3 months

## 2014-08-14 ENCOUNTER — Other Ambulatory Visit (INDEPENDENT_AMBULATORY_CARE_PROVIDER_SITE_OTHER): Payer: Medicare HMO

## 2014-08-14 DIAGNOSIS — E875 Hyperkalemia: Secondary | ICD-10-CM

## 2014-08-14 NOTE — Progress Notes (Signed)
Lab only 

## 2014-08-14 NOTE — Telephone Encounter (Signed)
Pt notfied of results Verbalizes understanding Will come in today for repeat K+

## 2014-08-15 LAB — POTASSIUM: POTASSIUM: 4.8 mmol/L (ref 3.5–5.2)

## 2014-08-17 ENCOUNTER — Encounter: Payer: Self-pay | Admitting: Nurse Practitioner

## 2014-09-23 ENCOUNTER — Other Ambulatory Visit: Payer: Self-pay | Admitting: Nurse Practitioner

## 2014-11-11 ENCOUNTER — Other Ambulatory Visit: Payer: Self-pay | Admitting: Nurse Practitioner

## 2014-11-11 NOTE — Telephone Encounter (Signed)
Last seen 08/12/14 MMM requesting 90 day supply

## 2014-11-17 ENCOUNTER — Encounter: Payer: Self-pay | Admitting: Nurse Practitioner

## 2014-11-17 ENCOUNTER — Ambulatory Visit (INDEPENDENT_AMBULATORY_CARE_PROVIDER_SITE_OTHER): Payer: Medicare HMO | Admitting: Nurse Practitioner

## 2014-11-17 VITALS — BP 152/72 | HR 82 | Temp 99.0°F | Ht 64.0 in | Wt 234.0 lb

## 2014-11-17 DIAGNOSIS — J069 Acute upper respiratory infection, unspecified: Secondary | ICD-10-CM | POA: Diagnosis not present

## 2014-11-17 DIAGNOSIS — F329 Major depressive disorder, single episode, unspecified: Secondary | ICD-10-CM | POA: Diagnosis not present

## 2014-11-17 DIAGNOSIS — R609 Edema, unspecified: Secondary | ICD-10-CM

## 2014-11-17 DIAGNOSIS — E785 Hyperlipidemia, unspecified: Secondary | ICD-10-CM

## 2014-11-17 DIAGNOSIS — I10 Essential (primary) hypertension: Secondary | ICD-10-CM

## 2014-11-17 DIAGNOSIS — R69 Illness, unspecified: Secondary | ICD-10-CM | POA: Diagnosis not present

## 2014-11-17 DIAGNOSIS — F32A Depression, unspecified: Secondary | ICD-10-CM

## 2014-11-17 DIAGNOSIS — E119 Type 2 diabetes mellitus without complications: Secondary | ICD-10-CM

## 2014-11-17 LAB — POCT GLYCOSYLATED HEMOGLOBIN (HGB A1C): HEMOGLOBIN A1C: 7

## 2014-11-17 MED ORDER — AMLODIPINE BESY-BENAZEPRIL HCL 2.5-10 MG PO CAPS: 1.0000 | ORAL_CAPSULE | Freq: Every day | ORAL | Status: DC

## 2014-11-17 MED ORDER — CITALOPRAM HYDROBROMIDE 40 MG PO TABS: 40.0000 mg | ORAL_TABLET | Freq: Every day | ORAL | Status: DC

## 2014-11-17 MED ORDER — GLIPIZIDE ER 2.5 MG PO TB24: 2.5000 mg | ORAL_TABLET | Freq: Every day | ORAL | Status: DC

## 2014-11-17 MED ORDER — METFORMIN HCL 1000 MG PO TABS: 1000.0000 mg | ORAL_TABLET | Freq: Two times a day (BID) | ORAL | Status: DC

## 2014-11-17 MED ORDER — AMOXICILLIN 875 MG PO TABS: 875.0000 mg | ORAL_TABLET | Freq: Two times a day (BID) | ORAL | Status: DC

## 2014-11-17 MED ORDER — ATORVASTATIN CALCIUM 40 MG PO TABS: 40.0000 mg | ORAL_TABLET | Freq: Every day | ORAL | Status: DC

## 2014-11-17 MED ORDER — FUROSEMIDE 40 MG PO TABS: 40.0000 mg | ORAL_TABLET | Freq: Every day | ORAL | Status: DC

## 2014-11-17 NOTE — Progress Notes (Signed)
Subjective:    Patient ID: Kimberly Mcdowell, female    DOB: 07/04/1930, 81 y.o.   MRN: [id number]  Patient here today for follow up of chronic medical problems.  *C/O of sore throat and productive cough that started Sunday. Denies any fever, reports chills, post nasal drip and bilateral ear pain. Has taken Advil and cough syrup with some relief.   Hypertension This is a chronic problem. The current episode started more than 1 year ago. The problem has been waxing and waning since onset. Pertinent negatives include no chest pain, orthopnea or palpitations. Risk factors for coronary artery disease include diabetes mellitus, dyslipidemia, obesity, post-menopausal state and sedentary lifestyle. Past treatments include calcium channel blockers and ACE inhibitors. The current treatment provides moderate improvement. Compliance problems include diet and exercise.  Hypertensive end-organ damage includes CAD/MI.  Hyperlipidemia This is a chronic problem. The current episode started more than 1 year ago. The problem is controlled. Recent lipid tests were reviewed and are normal. Exacerbating diseases include diabetes and obesity. She has no history of hypothyroidism. Pertinent negatives include no chest pain. Current antihyperlipidemic treatment includes statins. The current treatment provides moderate improvement of lipids. Compliance problems include adherence to diet and adherence to exercise.  Risk factors for coronary artery disease include diabetes mellitus, hypertension, obesity, post-menopausal and a sedentary lifestyle.  Diabetes She presents for her follow-up diabetic visit. She has type 2 diabetes mellitus. No MedicAlert identification noted. Her disease course has been fluctuating. There are no hypoglycemic associated symptoms. Associated symptoms include fatigue and foot paresthesias (Bilateral feet-burning sensation to plantar surface. ). Pertinent negatives for diabetes include no chest pain, no  polydipsia, no polyphagia and no weakness. There are no hypoglycemic complications. Symptoms are stable. Diabetic complications include peripheral neuropathy. Risk factors for coronary artery disease include diabetes mellitus, dyslipidemia, hypertension, obesity and post-menopausal. Current diabetic treatment includes oral agent (dual therapy). She is compliant with treatment most of the time. Her weight is stable. When asked about meal planning, she reported none. She has not had a previous visit with a dietitian. She rarely participates in exercise. There is no change in her home blood glucose trend. Her breakfast blood glucose is taken between 9-10 am. Her breakfast blood glucose range is generally 130-140 mg/dl. Her overall blood glucose range is 130-140 mg/dl. An ACE inhibitor/angiotensin II receptor blocker is being taken. She does not see a podiatrist.Eye exam is not current.  Depression Celexa- Keeps her calm and from worrying so much Diabetic neuropathy bil legs and feet Neurotin helps with discomfort- No medication side effects Hyperkalemia Still having  a hard time getting potassiium down and have actually resorted to [facility] for a couple of days we will check levels today to see if better- patient denies chest pain or lower ext cramping.   Review of Systems  Constitutional: Positive for fatigue. Negative for unexpected weight change. Activity change: activity level has actually increased.  HENT: Positive for congestion, ear pain, postnasal drip, sinus pressure and sore throat.   Eyes: Negative.   Respiratory: Positive for cough. Negative for chest tightness and wheezing.   Cardiovascular: Negative.  Negative for chest pain, palpitations, orthopnea and leg swelling.  Gastrointestinal: Negative for abdominal pain.  Endocrine: Negative for polydipsia and polyphagia.  Genitourinary: Negative.   Musculoskeletal: Positive for arthralgias (In bilateral hands and ankles. ).  Neurological:  Negative.  Negative for weakness.  Psychiatric/Behavioral: Negative.  Negative for suicidal ideas and self-injury.  All other systems reviewed and are negative.  Objective:   Physical Exam  Constitutional: She is oriented to person, place, and time. She appears well-developed and well-nourished.  HENT:  Nose: Nose normal.  Mouth/Throat: Oropharynx is clear and moist.  Bilateral cerumen impaction  Eyes: EOM are normal.  Neck: Trachea normal, normal range of motion and full passive range of motion without pain. Neck supple. No JVD present. Carotid bruit is not present. No thyromegaly present.  Cardiovascular: Normal rate, regular rhythm, normal heart sounds and intact distal pulses.  Exam reveals no gallop and no friction rub.   No murmur heard. Pulmonary/Chest: Effort normal and breath sounds normal.  Abdominal: Soft. Bowel sounds are normal. She exhibits no distension and no mass. There is no tenderness.  Musculoskeletal: Normal range of motion.  Lymphadenopathy:    She has no cervical adenopathy.  Neurological: She is alert and oriented to person, place, and time. She has normal reflexes.  Skin: Skin is warm and dry.  Psychiatric: She has a normal mood and affect. Her behavior is normal. Judgment and thought content normal.    BP 152/72 mmHg  Pulse 82  Temp(Src) 99 F (37.2 C) (Oral)  Ht '5\' 4"'  (1.626 m)  Wt 234 lb (106.142 kg)  BMI 40.15 kg/m2    Results for orders placed or performed in visit on 11/17/14  POCT glycosylated hemoglobin (Hb A1C)  Result Value Ref Range   Hemoglobin A1C 7.0     Assessment & Plan:   1. Essential hypertension No added salt to diet DASH diet - CMP14+EGFR - amlodipine-benazepril (LOTREL) 2.5-10 MG capsule; Take 1 capsule by mouth daily.  Dispense: 90 capsule; Refill: 1  2. Type 2 diabetes mellitus without complication, without long-term current use of insulin ([name]) Continue current medications - POCT glycosylated hemoglobin (Hb  A1C) - glipiZIDE (GLIPIZIDE XL) 2.5 MG 24 hr tablet; Take 1 tablet (2.5 mg total) by mouth daily.  Dispense: 90 tablet; Refill: 1 - metFORMIN (GLUCOPHAGE) 1000 MG tablet; Take 1 tablet (1,000 mg total) by mouth 2 (two) times daily with a meal.  Dispense: 180 tablet; Refill: 1  3. Hyperlipemia Low fat diet - Lipid panel - atorvastatin (LIPITOR) 40 MG tablet; Take 1 tablet (40 mg total) by mouth daily.  Dispense: 90 tablet; Refill: 1  4. Depression Stress management - citalopram (CELEXA) 40 MG tablet; Take 1 tablet (40 mg total) by mouth daily.  Dispense: 90 tablet; Refill: 1  5. Peripheral edema - furosemide (LASIX) 40 MG tablet; Take 1 tablet (40 mg total) by mouth daily.  Dispense: 90 tablet; Refill: 1  6. Acute upper respiratory infection  - amoxicillin (AMOXIL) 875 MG tablet; Take 1 tablet (875 mg total) by mouth 2 (two) times daily. 1 po BID  Dispense: 20 tablet; Refill: 0   Continue all meds Labs pending Health Maintenance reviewed Diet and exercise encouraged RTO 3 months  Mary-Margaret Hassell Done, FNP

## 2014-11-17 NOTE — Patient Instructions (Signed)
Upper Respiratory Infection, Adult Most upper respiratory infections (URIs) are a viral infection of the air passages leading to the lungs. A URI affects the nose, throat, and upper air passages. The most common type of URI is nasopharyngitis and is typically referred to as "the common cold." URIs run their course and usually go away on their own. Most of the time, a URI does not require medical attention, but sometimes a bacterial infection in the upper airways can follow a viral infection. This is called a secondary infection. Sinus and middle ear infections are common types of secondary upper respiratory infections. Bacterial pneumonia can also complicate a URI. A URI can worsen asthma and chronic obstructive pulmonary disease (COPD). Sometimes, these complications can require emergency medical care and may be life threatening.  CAUSES Almost all URIs are caused by viruses. A virus is a type of germ and can spread from one person to another.  RISKS FACTORS You may be at risk for a URI if:   You smoke.   You have chronic heart or lung disease.  You have a weakened defense (immune) system.   You are very young or very old.   You have nasal allergies or asthma.  You work in crowded or poorly ventilated areas.  You work in health care facilities or schools. SIGNS AND SYMPTOMS  Symptoms typically develop 2-3 days after you come in contact with a cold virus. Most viral URIs last 7-10 days. However, viral URIs from the influenza virus (flu virus) can last 14-18 days and are typically more severe. Symptoms may include:   Runny or stuffy (congested) nose.   Sneezing.   Cough.   Sore throat.   Headache.   Fatigue.   Fever.   Loss of appetite.   Pain in your forehead, behind your eyes, and over your cheekbones (sinus pain).  Muscle aches.  DIAGNOSIS  Your health care provider may diagnose a URI by:  Physical exam.  Tests to check that your symptoms are not due to  another condition such as:  Strep throat.  Sinusitis.  Pneumonia.  Asthma. TREATMENT  A URI goes away on its own with time. It cannot be cured with medicines, but medicines may be prescribed or recommended to relieve symptoms. Medicines may help:  Reduce your fever.  Reduce your cough.  Relieve nasal congestion. HOME CARE INSTRUCTIONS   Take medicines only as directed by your health care provider.   Gargle warm saltwater or take cough drops to comfort your throat as directed by your health care provider.  Use a warm mist humidifier or inhale steam from a shower to increase air moisture. This may make it easier to breathe.  Drink enough fluid to keep your urine clear or pale yellow.   Eat soups and other clear broths and maintain good nutrition.   Rest as needed.   Return to work when your temperature has returned to normal or as your health care provider advises. You may need to stay home longer to avoid infecting others. You can also use a face mask and careful hand washing to prevent spread of the virus.  Increase the usage of your inhaler if you have asthma.   Do not use any tobacco products, including cigarettes, chewing tobacco, or electronic cigarettes. If you need help quitting, ask your health care provider. PREVENTION  The best way to protect yourself from getting a cold is to practice good hygiene.   Avoid oral or hand contact with people with cold   symptoms.   Wash your hands often if contact occurs.  There is no clear evidence that vitamin C, vitamin E, echinacea, or exercise reduces the chance of developing a cold. However, it is always recommended to get plenty of rest, exercise, and practice good nutrition.  SEEK MEDICAL CARE IF:   You are getting worse rather than better.   Your symptoms are not controlled by medicine.   You have chills.  You have worsening shortness of breath.  You have brown or red mucus.  You have yellow or brown nasal  discharge.  You have pain in your face, especially when you bend forward.  You have a fever.  You have swollen neck glands.  You have pain while swallowing.  You have white areas in the back of your throat. SEEK IMMEDIATE MEDICAL CARE IF:   You have severe or persistent:  Headache.  Ear pain.  Sinus pain.  Chest pain.  You have chronic lung disease and any of the following:  Wheezing.  Prolonged cough.  Coughing up blood.  A change in your usual mucus.  You have a stiff neck.  You have changes in your:  Vision.  Hearing.  Thinking.  Mood. MAKE SURE YOU:   Understand these instructions.  Will watch your condition.  Will get help right away if you are not doing well or get worse.   This information is not intended to replace advice given to you by your health care provider. Make sure you discuss any questions you have with your health care provider.   Document Released: 07/05/2000 Document Revised: 05/26/2014 Document Reviewed: 04/16/2013 Elsevier Interactive Patient Education 2016 [employer] Inc[employer]  

## 2014-11-18 LAB — LIPID PANEL
CHOL/HDL RATIO: 3.8 ratio (ref 0.0–4.4)
CHOLESTEROL TOTAL: 205 mg/dL — AB (ref 100–199)
HDL: 54 mg/dL (ref >39)
LDL CALC: 128 mg/dL — AB (ref 0–99)
TRIGLYCERIDES: 114 mg/dL (ref 0–149)
VLDL Cholesterol Cal: 23 mg/dL (ref 5–40)

## 2014-11-18 LAB — CMP14+EGFR
ALBUMIN: 3.9 g/dL (ref 3.5–4.7)
ALT: 17 IU/L (ref 0–32)
AST: 14 IU/L (ref 0–40)
Albumin/Globulin Ratio: 1.3 (ref 1.1–2.5)
Alkaline Phosphatase: 81 IU/L (ref 39–117)
BUN/Creatinine Ratio: 14 (ref 11–26)
BUN: 11 mg/dL (ref 8–27)
Bilirubin Total: 0.3 mg/dL (ref 0.0–1.2)
CO2: 27 mmol/L (ref 18–29)
CREATININE: 0.79 mg/dL (ref 0.57–1.00)
Calcium: 8.8 mg/dL (ref 8.7–10.3)
Chloride: 99 mmol/L (ref 97–106)
GFR calc Af Amer: 81 mL/min/{1.73_m2} (ref >59)
GFR calc non Af Amer: 70 mL/min/{1.73_m2} (ref >59)
Globulin, Total: 3 g/dL (ref 1.5–4.5)
Glucose: 144 mg/dL — ABNORMAL HIGH (ref 65–99)
Potassium: 4.8 mmol/L (ref 3.5–5.2)
SODIUM: 142 mmol/L (ref 136–144)
TOTAL PROTEIN: 6.9 g/dL (ref 6.0–8.5)

## 2014-12-14 ENCOUNTER — Other Ambulatory Visit: Payer: Self-pay | Admitting: Nurse Practitioner

## 2014-12-29 ENCOUNTER — Ambulatory Visit: Payer: Medicare HMO

## 2015-02-10 ENCOUNTER — Other Ambulatory Visit: Payer: Self-pay | Admitting: Nurse Practitioner

## 2015-02-19 ENCOUNTER — Ambulatory Visit: Payer: Medicare HMO | Admitting: Nurse Practitioner

## 2015-02-24 ENCOUNTER — Encounter: Payer: Self-pay | Admitting: Nurse Practitioner

## 2015-02-24 ENCOUNTER — Ambulatory Visit (INDEPENDENT_AMBULATORY_CARE_PROVIDER_SITE_OTHER): Payer: Medicare HMO | Admitting: Nurse Practitioner

## 2015-02-24 VITALS — BP 142/67 | HR 75 | Temp 97.0°F | Ht 64.0 in | Wt 233.0 lb

## 2015-02-24 DIAGNOSIS — R509 Fever, unspecified: Secondary | ICD-10-CM | POA: Diagnosis not present

## 2015-02-24 DIAGNOSIS — R609 Edema, unspecified: Secondary | ICD-10-CM | POA: Diagnosis not present

## 2015-02-24 DIAGNOSIS — E785 Hyperlipidemia, unspecified: Secondary | ICD-10-CM | POA: Diagnosis not present

## 2015-02-24 DIAGNOSIS — R069 Unspecified abnormalities of breathing: Secondary | ICD-10-CM | POA: Diagnosis not present

## 2015-02-24 DIAGNOSIS — N3001 Acute cystitis with hematuria: Secondary | ICD-10-CM

## 2015-02-24 DIAGNOSIS — E119 Type 2 diabetes mellitus without complications: Secondary | ICD-10-CM | POA: Diagnosis not present

## 2015-02-24 DIAGNOSIS — R69 Illness, unspecified: Secondary | ICD-10-CM | POA: Diagnosis not present

## 2015-02-24 DIAGNOSIS — I10 Essential (primary) hypertension: Secondary | ICD-10-CM

## 2015-02-24 DIAGNOSIS — F411 Generalized anxiety disorder: Secondary | ICD-10-CM

## 2015-02-24 LAB — POCT UA - MICROSCOPIC ONLY
CASTS, UR, LPF, POC: NEGATIVE
CRYSTALS, UR, HPF, POC: NEGATIVE
Mucus, UA: NEGATIVE
Yeast, UA: NEGATIVE

## 2015-02-24 LAB — POCT GLYCOSYLATED HEMOGLOBIN (HGB A1C): Hemoglobin A1C: 7.7

## 2015-02-24 LAB — POCT URINALYSIS DIPSTICK
Bilirubin, UA: NEGATIVE
GLUCOSE UA: NEGATIVE
Ketones, UA: NEGATIVE
Nitrite, UA: POSITIVE
SPEC GRAV UA: 1.02
UROBILINOGEN UA: NEGATIVE
pH, UA: 5

## 2015-02-24 MED ORDER — CIPROFLOXACIN HCL 500 MG PO TABS: 500.0000 mg | ORAL_TABLET | Freq: Two times a day (BID) | ORAL | Status: DC

## 2015-02-24 NOTE — Progress Notes (Addendum)
Subjective:    Patient ID: Kimberly Mcdowell, female    DOB: 11/01/1930, 82 y.o.   MRN: [id number]  Patient here today for follow up of chronic medical problems.  Outpatient Encounter Prescriptions as of 02/24/2015  Medication Sig  . amlodipine-benazepril (LOTREL) 2.5-10 MG capsule Take 1 capsule by mouth daily.  .    . atorvastatin (LIPITOR) 40 MG tablet Take 1 tablet (40 mg total) by mouth daily.  [name] b complex vitamins tablet Take 1 tablet by mouth daily.  . Cholecalciferol (VITAMIN D3) 2000 UNITS TABS Take 2,000 Int'l Units by mouth daily.    . citalopram (CELEXA) 40 MG tablet Take 1 tablet (40 mg total) by mouth daily.  . furosemide (LASIX) 40 MG tablet Take 1 tablet (40 mg total) by mouth daily.  [name] gabapentin (NEURONTIN) 100 MG capsule TAKE 1 CAPSULE (100 MG TOTAL) BY MOUTH 3 (THREE) TIMES DAILY.  [name] glipiZIDE (GLIPIZIDE XL) 2.5 MG 24 hr tablet Take 1 tablet (2.5 mg total) by mouth daily.  . metFORMIN (GLUCOPHAGE) 1000 MG tablet Take 1 tablet (1,000 mg total) by mouth 2 (two) times daily with a meal.  . metoprolol (LOPRESSOR) 50 MG tablet TAKE ONE TABLET BY MOUTH TWICE DAILY   No facility-administered encounter medications on file as of 02/24/2015.   * Patient said that she called 911 last night because she had and episode of feel hot- hands start shaking , says trouble talking, because teeth are chattering- lasted about 63mnutes. Has been occuring intermittently for 2 weeeks.  Hypertension This is a chronic problem. The current episode started more than 1 year ago. The problem has been waxing and waning since onset. Pertinent negatives include no chest pain, orthopnea or palpitations. Risk factors for coronary artery disease include diabetes mellitus, dyslipidemia, obesity, post-menopausal state and sedentary lifestyle. Past treatments include calcium channel blockers and ACE inhibitors. The current treatment provides moderate improvement. Compliance problems include diet and exercise.   Hypertensive end-organ damage includes CAD/MI.  Hyperlipidemia This is a chronic problem. The current episode started more than 1 year ago. The problem is controlled. Recent lipid tests were reviewed and are normal. Exacerbating diseases include diabetes and obesity. She has no history of hypothyroidism. Pertinent negatives include no chest pain. Current antihyperlipidemic treatment includes statins. The current treatment provides moderate improvement of lipids. Compliance problems include adherence to diet and adherence to exercise.  Risk factors for coronary artery disease include diabetes mellitus, hypertension, obesity, post-menopausal and a sedentary lifestyle.  Diabetes She presents for her follow-up diabetic visit. She has type 2 diabetes mellitus. No MedicAlert identification noted. Her disease course has been fluctuating. There are no hypoglycemic associated symptoms. Associated symptoms include fatigue and foot paresthesias (Bilateral feet-burning sensation to plantar surface. ). Pertinent negatives for diabetes include no chest pain, no polydipsia, no polyphagia and no weakness. There are no hypoglycemic complications. Symptoms are stable. Diabetic complications include peripheral neuropathy. Risk factors for coronary artery disease include diabetes mellitus, dyslipidemia, hypertension, obesity and post-menopausal. Current diabetic treatment includes oral agent (dual therapy). She is compliant with treatment most of the time. Her weight is stable. When asked about meal planning, she reported none. She has not had a previous visit with a dietitian. She rarely participates in exercise. There is no change in her home blood glucose trend. Her breakfast blood glucose is taken between 9-10 am. Her breakfast blood glucose range is generally 130-140 mg/dl. Her overall blood glucose range is 130-140 mg/dl. An ACE inhibitor/angiotensin II receptor blocker is being  taken. She does not see a podiatrist.Eye exam  is not current.  Depression Celexa- Keeps her calm and from worrying so much Diabetic neuropathy bil legs and feet Neurotin helps with discomfort- No medication side effects Hyperkalemia Always have trouble with potassium levels and have to  resort to kaexlate for a couple of days we will check levels today to see if better- patient denies chest pain or lower ext cramping.   Review of Systems  Constitutional: Positive for fatigue. Negative for unexpected weight change. Activity change: activity level has actually increased.  HENT: Positive for congestion, ear pain, postnasal drip, sinus pressure and sore throat.   Eyes: Negative.   Respiratory: Positive for cough. Negative for chest tightness and wheezing.   Cardiovascular: Negative.  Negative for chest pain, palpitations, orthopnea and leg swelling.  Gastrointestinal: Negative for abdominal pain.  Endocrine: Negative for polydipsia and polyphagia.  Genitourinary: Negative.   Musculoskeletal: Positive for arthralgias (In bilateral hands and ankles. ).  Neurological: Negative.  Negative for weakness.  Psychiatric/Behavioral: Negative.  Negative for suicidal ideas and self-injury.  All other systems reviewed and are negative.      Objective:   Physical Exam  Constitutional: She is oriented to person, place, and time. She appears well-developed and well-nourished.  HENT:  Nose: Nose normal.  Mouth/Throat: Oropharynx is clear and moist.  Bilateral cerumen impaction  Eyes: EOM are normal.  Neck: Trachea normal, normal range of motion and full passive range of motion without pain. Neck supple. No JVD present. Carotid bruit is not present. No thyromegaly present.  Cardiovascular: Normal rate, regular rhythm, normal heart sounds and intact distal pulses.  Exam reveals no gallop and no friction rub.   No murmur heard. Pulmonary/Chest: Effort normal and breath sounds normal.  Abdominal: Soft. Bowel sounds are normal. She exhibits no  distension and no mass. There is no tenderness.  Musculoskeletal: Normal range of motion.  Lymphadenopathy:    She has no cervical adenopathy.  Neurological: She is alert and oriented to person, place, and time. She has normal reflexes.  Skin: Skin is warm and dry.  Psychiatric: She has a normal mood and affect. Her behavior is normal. Judgment and thought content normal.    BP 142/67 mmHg  Pulse 75  Temp(Src) 97 F (36.1 C) (Oral)  Ht _0  (1.626 m)  Wt 233 lb (105.688 kg)  BMI 39.97 kg/m2  Results for orders placed or performed in visit on 02/24/15  POCT glycosylated hemoglobin (Hb A1C)  Result Value Ref Range   Hemoglobin A1C 7.7          Assessment & Plan:   1. Essential hypertension Do not add salt to diet - CMP14+EGFR  2. Type 2 diabetes mellitus without complication, without long-term current use of insulin ([name]) Stricter carb counting - POCT glycosylated hemoglobin (Hb A1C) - Microalbumin / creatinine urine ratio  3. GAD (generalized anxiety disorder) Stress management  4. Hyperlipidemia Low fat diet - Lipid panel  5. Peripheral edema Elevate legs when sitting  6. Morbid obesity, unspecified obesity type ([place]) Discussed diet and exercise for person with BMI >25 Will recheck weight in 3-6 months  7. Fever, unspecified Motrin OTC as needed - POCT UA - Microscopic Only - POCT urinalysis dipstick  8. Acute cystitis with hematuria Take medication as prescribe Cotton underwear Take shower not bath Cranberry juice, yogurt Force fluids AZO over the counter X2 days Culture pending RTO prn - Urine culture - ciprofloxacin (CIPRO) 500 MG tablet; Take 1  tablet (500 mg total) by mouth 2 (two) times daily.  Dispense: 10 tablet; Refill: 0   Encouraged to have eye exam Labs pending Health maintenance reviewed Diet and exercise encouraged Continue all meds Follow up  In 3 months   [place], FNP

## 2015-02-24 NOTE — Patient Instructions (Signed)
Asymptomatic Bacteriuria, Female Asymptomatic bacteriuria is the presence of a large number of bacteria in your urine without the usual symptoms of burning or frequent urination. The following conditions increase the risk of asymptomatic bacteriuria:  Diabetes mellitus.  Advanced age.  Pregnancy in the first trimester.  Kidney stones.  Kidney transplants.  Leaky kidney tube valve in young children (reflux). Treatment for this condition is not needed in most people and can lead to other problems such as too much yeast and growth of resistant bacteria. However, some people, such as pregnant women, do need treatment to prevent kidney infection. Asymptomatic bacteriuria in pregnancy is also associated with fetal growth restriction, premature labor, and newborn death. HOME CARE INSTRUCTIONS Monitor your condition for any changes. The following actions may help to relieve any discomfort you are feeling:  Drink enough water and fluids to keep your urine clear or pale yellow. Go to the bathroom more often to keep your bladder empty.  Keep the area around your vagina and rectum clean. Wipe yourself from front to back after urinating. SEEK IMMEDIATE MEDICAL CARE IF:  You develop signs of an infection such as:  Burning with urination.  Frequency of voiding.  Back pain.  Fever.  You have blood in the urine.  You develop a fever. MAKE SURE YOU:  Understand these instructions.  Will watch your condition.  Will get help right away if you are not doing well or get worse.   This information is not intended to replace advice given to you by your health care provider. Make sure you discuss any questions you have with your health care provider.   Document Released: 01/09/2005 Document Revised: 01/30/2014 Document Reviewed: 07/01/2012 Elsevier Interactive Patient Education 2016 [employer] Inc.  

## 2015-02-25 LAB — CMP14+EGFR
A/G RATIO: 1.3 (ref 1.1–2.5)
ALBUMIN: 3.7 g/dL (ref 3.5–4.7)
ALT: 14 IU/L (ref 0–32)
AST: 15 IU/L (ref 0–40)
Alkaline Phosphatase: 81 IU/L (ref 39–117)
BILIRUBIN TOTAL: 0.5 mg/dL (ref 0.0–1.2)
BUN / CREAT RATIO: 13 (ref 11–26)
BUN: 15 mg/dL (ref 8–27)
CO2: 25 mmol/L (ref 18–29)
Calcium: 8.6 mg/dL — ABNORMAL LOW (ref 8.7–10.3)
Chloride: 97 mmol/L (ref 96–106)
Creatinine, Ser: 1.14 mg/dL — ABNORMAL HIGH (ref 0.57–1.00)
GFR calc non Af Amer: 45 mL/min/{1.73_m2} — ABNORMAL LOW (ref >59)
GFR, EST AFRICAN AMERICAN: 52 mL/min/{1.73_m2} — AB (ref >59)
GLUCOSE: 155 mg/dL — AB (ref 65–99)
Globulin, Total: 2.8 g/dL (ref 1.5–4.5)
Potassium: 5.3 mmol/L — ABNORMAL HIGH (ref 3.5–5.2)
Sodium: 140 mmol/L (ref 134–144)
TOTAL PROTEIN: 6.5 g/dL (ref 6.0–8.5)

## 2015-02-25 LAB — MICROALBUMIN / CREATININE URINE RATIO
Creatinine, Urine: 265.7 mg/dL
MICROALB/CREAT RATIO: 46.7 mg/g{creat} — AB (ref 0.0–30.0)
MICROALBUM., U, RANDOM: 124.2 ug/mL

## 2015-02-25 LAB — LIPID PANEL
CHOL/HDL RATIO: 3.6 ratio (ref 0.0–4.4)
CHOLESTEROL TOTAL: 169 mg/dL (ref 100–199)
HDL: 47 mg/dL (ref >39)
LDL CALC: 96 mg/dL (ref 0–99)
Triglycerides: 131 mg/dL (ref 0–149)
VLDL Cholesterol Cal: 26 mg/dL (ref 5–40)

## 2015-02-26 ENCOUNTER — Other Ambulatory Visit: Payer: Medicare HMO

## 2015-02-26 ENCOUNTER — Telehealth: Payer: Self-pay | Admitting: Nurse Practitioner

## 2015-02-26 ENCOUNTER — Other Ambulatory Visit: Payer: Self-pay | Admitting: *Deleted

## 2015-02-26 DIAGNOSIS — E875 Hyperkalemia: Secondary | ICD-10-CM

## 2015-02-26 NOTE — Telephone Encounter (Signed)
Patient aware of results.

## 2015-02-27 LAB — BMP8+EGFR
BUN/Creatinine Ratio: 17 (ref 11–26)
BUN: 18 mg/dL (ref 8–27)
CALCIUM: 8.7 mg/dL (ref 8.7–10.3)
CO2: 25 mmol/L (ref 18–29)
CREATININE: 1.08 mg/dL — AB (ref 0.57–1.00)
Chloride: 100 mmol/L (ref 96–106)
GFR calc Af Amer: 55 mL/min/{1.73_m2} — ABNORMAL LOW (ref >59)
GFR, EST NON AFRICAN AMERICAN: 48 mL/min/{1.73_m2} — AB (ref >59)
GLUCOSE: 223 mg/dL — AB (ref 65–99)
Potassium: 4.5 mmol/L (ref 3.5–5.2)
SODIUM: 144 mmol/L (ref 134–144)

## 2015-02-28 LAB — URINE CULTURE

## 2015-05-10 ENCOUNTER — Other Ambulatory Visit: Payer: Self-pay | Admitting: Nurse Practitioner

## 2015-05-12 ENCOUNTER — Other Ambulatory Visit: Payer: Self-pay

## 2015-05-12 MED ORDER — GABAPENTIN 100 MG PO CAPS: ORAL_CAPSULE | ORAL | Status: DC

## 2015-06-08 ENCOUNTER — Ambulatory Visit (INDEPENDENT_AMBULATORY_CARE_PROVIDER_SITE_OTHER): Payer: Medicare HMO | Admitting: Nurse Practitioner

## 2015-06-08 ENCOUNTER — Encounter: Payer: Self-pay | Admitting: Nurse Practitioner

## 2015-06-08 VITALS — BP 148/82 | HR 63 | Temp 98.3°F | Ht 64.0 in | Wt 234.0 lb

## 2015-06-08 DIAGNOSIS — F32A Depression, unspecified: Secondary | ICD-10-CM

## 2015-06-08 DIAGNOSIS — F329 Major depressive disorder, single episode, unspecified: Secondary | ICD-10-CM | POA: Diagnosis not present

## 2015-06-08 DIAGNOSIS — F411 Generalized anxiety disorder: Secondary | ICD-10-CM | POA: Diagnosis not present

## 2015-06-08 DIAGNOSIS — E1142 Type 2 diabetes mellitus with diabetic polyneuropathy: Secondary | ICD-10-CM

## 2015-06-08 DIAGNOSIS — I10 Essential (primary) hypertension: Secondary | ICD-10-CM

## 2015-06-08 DIAGNOSIS — E119 Type 2 diabetes mellitus without complications: Secondary | ICD-10-CM | POA: Diagnosis not present

## 2015-06-08 DIAGNOSIS — E785 Hyperlipidemia, unspecified: Secondary | ICD-10-CM

## 2015-06-08 DIAGNOSIS — R69 Illness, unspecified: Secondary | ICD-10-CM | POA: Diagnosis not present

## 2015-06-08 DIAGNOSIS — R600 Localized edema: Secondary | ICD-10-CM

## 2015-06-08 DIAGNOSIS — R609 Edema, unspecified: Secondary | ICD-10-CM

## 2015-06-08 LAB — LIPID PANEL
CHOLESTEROL TOTAL: 199 mg/dL (ref 100–199)
Chol/HDL Ratio: 3.6 ratio units (ref 0.0–4.4)
HDL: 55 mg/dL (ref >39)
LDL Calculated: 121 mg/dL — ABNORMAL HIGH (ref 0–99)
Triglycerides: 117 mg/dL (ref 0–149)
VLDL CHOLESTEROL CAL: 23 mg/dL (ref 5–40)

## 2015-06-08 LAB — CMP14+EGFR
A/G RATIO: 1.7 (ref 1.2–2.2)
ALBUMIN: 4.2 g/dL (ref 3.5–4.7)
ALK PHOS: 78 IU/L (ref 39–117)
ALT: 28 IU/L (ref 0–32)
AST: 18 IU/L (ref 0–40)
BILIRUBIN TOTAL: 0.3 mg/dL (ref 0.0–1.2)
BUN/Creatinine Ratio: 15 (ref 12–28)
BUN: 14 mg/dL (ref 8–27)
CHLORIDE: 100 mmol/L (ref 96–106)
CO2: 24 mmol/L (ref 18–29)
Calcium: 9.1 mg/dL (ref 8.7–10.3)
Creatinine, Ser: 0.91 mg/dL (ref 0.57–1.00)
GFR calc non Af Amer: 59 mL/min/{1.73_m2} — ABNORMAL LOW (ref >59)
GFR, EST AFRICAN AMERICAN: 68 mL/min/{1.73_m2} (ref >59)
GLOBULIN, TOTAL: 2.5 g/dL (ref 1.5–4.5)
Glucose: 129 mg/dL — ABNORMAL HIGH (ref 65–99)
POTASSIUM: 4.9 mmol/L (ref 3.5–5.2)
Sodium: 139 mmol/L (ref 134–144)
Total Protein: 6.7 g/dL (ref 6.0–8.5)

## 2015-06-08 LAB — BAYER DCA HB A1C WAIVED: HB A1C (BAYER DCA - WAIVED): 7.3 % — ABNORMAL HIGH (ref <7.0)

## 2015-06-08 MED ORDER — AMLODIPINE BESY-BENAZEPRIL HCL 2.5-10 MG PO CAPS: 1.0000 | ORAL_CAPSULE | Freq: Every day | ORAL | Status: DC

## 2015-06-08 MED ORDER — GABAPENTIN 100 MG PO CAPS: ORAL_CAPSULE | ORAL | Status: DC

## 2015-06-08 MED ORDER — FUROSEMIDE 40 MG PO TABS: 40.0000 mg | ORAL_TABLET | Freq: Every day | ORAL | Status: DC

## 2015-06-08 MED ORDER — METFORMIN HCL 1000 MG PO TABS: 1000.0000 mg | ORAL_TABLET | Freq: Two times a day (BID) | ORAL | Status: DC

## 2015-06-08 MED ORDER — CITALOPRAM HYDROBROMIDE 40 MG PO TABS: 40.0000 mg | ORAL_TABLET | Freq: Every day | ORAL | Status: DC

## 2015-06-08 MED ORDER — GLIPIZIDE ER 2.5 MG PO TB24: 2.5000 mg | ORAL_TABLET | Freq: Every day | ORAL | Status: DC

## 2015-06-08 MED ORDER — METOPROLOL TARTRATE 50 MG PO TABS: 50.0000 mg | ORAL_TABLET | Freq: Two times a day (BID) | ORAL | Status: DC

## 2015-06-08 MED ORDER — ATORVASTATIN CALCIUM 40 MG PO TABS: 40.0000 mg | ORAL_TABLET | Freq: Every day | ORAL | Status: DC

## 2015-06-08 NOTE — Patient Instructions (Signed)
Diabetes and Foot Care Diabetes may cause you to have problems because of poor blood supply (circulation) to your feet and legs. This may cause the skin on your feet to become thinner, break easier, and heal more slowly. Your skin may become dry, and the skin may peel and crack. You may also have nerve damage in your legs and feet causing decreased feeling in them. You may not notice minor injuries to your feet that could lead to infections or more serious problems. Taking care of your feet is one of the most important things you can do for yourself.  HOME CARE INSTRUCTIONS  Wear shoes at all times, even in the house. Do not go barefoot. Bare feet are easily injured.  Check your feet daily for blisters, cuts, and redness. If you cannot see the bottom of your feet, use a mirror or ask someone for help.  Wash your feet with warm water (do not use hot water) and mild soap. Then pat your feet and the areas between your toes until they are completely dry. Do not soak your feet as this can dry your skin.  Apply a moisturizing lotion or petroleum jelly (that does not contain alcohol and is unscented) to the skin on your feet and to dry, brittle toenails. Do not apply lotion between your toes.  Trim your toenails straight across. Do not dig under them or around the cuticle. File the edges of your nails with an emery board or nail file.  Do not cut corns or calluses or try to remove them with medicine.  Wear clean socks or stockings every day. Make sure they are not too tight. Do not wear knee-high stockings since they may decrease blood flow to your legs.  Wear shoes that fit properly and have enough cushioning. To break in new shoes, wear them for just a few hours a day. This prevents you from injuring your feet. Always look in your shoes before you put them on to be sure there are no objects inside.  Do not cross your legs. This may decrease the blood flow to your feet.  If you find a minor scrape,  cut, or break in the skin on your feet, keep it and the skin around it clean and dry. These areas may be cleansed with mild soap and water. Do not cleanse the area with peroxide, alcohol, or iodine.  When you remove an adhesive bandage, be sure not to damage the skin around it.  If you have a wound, look at it several times a day to make sure it is healing.  Do not use heating pads or hot water bottles. They may burn your skin. If you have lost feeling in your feet or legs, you may not know it is happening until it is too late.  Make sure your health care provider performs a complete foot exam at least annually or more often if you have foot problems. Report any cuts, sores, or bruises to your health care provider immediately. SEEK MEDICAL CARE IF:   You have an injury that is not healing.  You have cuts or breaks in the skin.  You have an ingrown nail.  You notice redness on your legs or feet.  You feel burning or tingling in your legs or feet.  You have pain or cramps in your legs and feet.  Your legs or feet are numb.  Your feet always feel cold. SEEK IMMEDIATE MEDICAL CARE IF:   There is increasing redness,   swelling, or pain in or around a wound.  There is a red line that goes up your leg.  Pus is coming from a wound.  You develop a fever or as directed by your health care provider.  You notice a bad smell coming from an ulcer or wound.   This information is not intended to replace advice given to you by your health care provider. Make sure you discuss any questions you have with your health care provider.   Document Released: 01/07/2000 Document Revised: 09/11/2012 Document Reviewed: 06/18/2012 Elsevier Interactive Patient Education 2016 [employer]  

## 2015-06-08 NOTE — Progress Notes (Signed)
Subjective:    Patient ID: Kimberly Mcdowell, female    DOB: [birth year], 82 y.o.   MRN: [id number]  Patient here today for follow up of chronic medical problems.  Outpatient Encounter Prescriptions as of 06/08/2015  Medication Sig  . amlodipine-benazepril (LOTREL) 2.5-10 MG capsule Take 1 capsule by mouth daily.  [name] atorvastatin (LIPITOR) 40 MG tablet Take 1 tablet (40 mg total) by mouth daily.  [name] b complex vitamins tablet Take 1 tablet by mouth daily.  . Cholecalciferol (VITAMIN D3) 2000 UNITS TABS Take 2,000 Int'l Units by mouth daily.    . ciprofloxacin (CIPRO) 500 MG tablet Take 1 tablet (500 mg total) by mouth 2 (two) times daily.  . citalopram (CELEXA) 40 MG tablet Take 1 tablet (40 mg total) by mouth daily.  . furosemide (LASIX) 40 MG tablet Take 1 tablet (40 mg total) by mouth daily.  [name] gabapentin (NEURONTIN) 100 MG capsule TAKE 1 CAPSULE (100 MG TOTAL) BY MOUTH 3 (THREE) TIMES DAILY.  [name] glipiZIDE (GLUCOTROL XL) 2.5 MG 24 hr tablet TAKE 1 TABLET DAILY  . metFORMIN (GLUCOPHAGE) 1000 MG tablet Take 1 tablet (1,000 mg total) by mouth 2 (two) times daily with a meal.  . metoprolol (LOPRESSOR) 50 MG tablet TAKE ONE TABLET BY MOUTH TWICE DAILY   No facility-administered encounter medications on file as of 06/08/2015.      Hypertension This is a chronic problem. The current episode started more than 1 year ago. The problem has been waxing and waning since onset. Pertinent negatives include no chest pain, orthopnea or palpitations. Risk factors for coronary artery disease include diabetes mellitus, dyslipidemia, obesity, post-menopausal state and sedentary lifestyle. Past treatments include calcium channel blockers and ACE inhibitors. The current treatment provides moderate improvement. Compliance problems include diet and exercise.  Hypertensive end-organ damage includes CAD/MI.  Hyperlipidemia This is a chronic problem. The current episode started more than 1 year ago. The problem is  controlled. Recent lipid tests were reviewed and are normal. Exacerbating diseases include diabetes and obesity. She has no history of hypothyroidism. Pertinent negatives include no chest pain. Current antihyperlipidemic treatment includes statins. The current treatment provides moderate improvement of lipids. Compliance problems include adherence to diet and adherence to exercise.  Risk factors for coronary artery disease include diabetes mellitus, hypertension, obesity, post-menopausal and a sedentary lifestyle.  Diabetes She presents for her follow-up diabetic visit. She has type 2 diabetes mellitus. No MedicAlert identification noted. Her disease course has been fluctuating. There are no hypoglycemic associated symptoms. Associated symptoms include fatigue and foot paresthesias (Bilateral feet-burning sensation to plantar surface. ). Pertinent negatives for diabetes include no chest pain, no polydipsia, no polyphagia and no weakness. There are no hypoglycemic complications. Symptoms are stable. Diabetic complications include peripheral neuropathy. Risk factors for coronary artery disease include diabetes mellitus, dyslipidemia, hypertension, obesity and post-menopausal. Current diabetic treatment includes oral agent (dual therapy). She is compliant with treatment most of the time. Her weight is stable. When asked about meal planning, she reported none. She has not had a previous visit with a dietitian. She rarely participates in exercise. There is no change in her home blood glucose trend. Her breakfast blood glucose is taken between 9-10 am. Her breakfast blood glucose range is generally 130-140 mg/dl. Her overall blood glucose range is 130-140 mg/dl. An ACE inhibitor/angiotensin II receptor blocker is being taken. She does not see a podiatrist.Eye exam is not current.  Depression Celexa- Keeps her calm and from worrying so much Diabetic neuropathy bil legs  and feet Neurotin helps with discomfort- No  medication side effects Hyperkalemia Still having  a hard time getting potassiium down and have actually resorted to [facility] [employer] for a couple of days we will check levels today to see if better- patient denies chest pain or lower ext cramping.   Review of Systems  Constitutional: Positive for fatigue. Negative for unexpected weight change. Activity change: activity level has actually increased.  HENT: Positive for congestion, ear pain, postnasal drip, sinus pressure and sore throat.   Eyes: Negative.   Respiratory: Positive for cough. Negative for chest tightness and wheezing.   Cardiovascular: Negative.  Negative for chest pain, palpitations, orthopnea and leg swelling.  Gastrointestinal: Negative for abdominal pain.  Endocrine: Negative for polydipsia and polyphagia.  Genitourinary: Negative.   Musculoskeletal: Positive for arthralgias (In bilateral hands and ankles. ).  Neurological: Negative.  Negative for weakness.  Psychiatric/Behavioral: Negative.  Negative for suicidal ideas and self-injury.  All other systems reviewed and are negative.      Objective:   Physical Exam  Constitutional: She is oriented to person, place, and time. She appears well-developed and well-nourished.  HENT:  Nose: Nose normal.  Mouth/Throat: Oropharynx is clear and moist.  Bilateral cerumen impaction  Eyes: EOM are normal.  Neck: Trachea normal, normal range of motion and full passive range of motion without pain. Neck supple. No JVD present. Carotid bruit is not present. No thyromegaly present.  Cardiovascular: Normal rate, regular rhythm, normal heart sounds and intact distal pulses.  Exam reveals no gallop and no friction rub.   No murmur heard. Pulmonary/Chest: Effort normal and breath sounds normal.  Abdominal: Soft. Bowel sounds are normal. She exhibits no distension and no mass. There is no tenderness.  Musculoskeletal: Normal range of motion.  Lymphadenopathy:    She has no cervical adenopathy.   Neurological: She is alert and oriented to person, place, and time. She has normal reflexes.  Skin: Skin is warm and dry.  Psychiatric: She has a normal mood and affect. Her behavior is normal. Judgment and thought content normal.   BP 148/82 mmHg  Pulse 63  Temp(Src) 98.3 F (36.8 C) (Oral)  Ht _0  (1.626 m)  Wt 234 lb (106.142 kg)  BMI 40.15 kg/m2  hgba1c-7.3% down from 7.7% at last visit  Assessment & Plan:   1. Type 2 diabetes mellitus without complication, without long-term current use of insulin ([name]) Continue to watch carbs in diet - Bayer DCA Hb A1c Waived - metFORMIN (GLUCOPHAGE) 1000 MG tablet; Take 1 tablet (1,000 mg total) by mouth 2 (two) times daily with a meal.  Dispense: 180 tablet; Refill: 1 - glipiZIDE (GLUCOTROL XL) 2.5 MG 24 hr tablet; Take 1 tablet (2.5 mg total) by mouth daily.  Dispense: 90 tablet; Refill: 1  2. Essential hypertension Do not add salt to diet - CMP14+EGFR - amlodipine-benazepril (LOTREL) 2.5-10 MG capsule; Take 1 capsule by mouth daily.  Dispense: 90 capsule; Refill: 1 - metoprolol (LOPRESSOR) 50 MG tablet; Take 1 tablet (50 mg total) by mouth 2 (two) times daily.  Dispense: 180 tablet; Refill: 1  3. Hyperlipidemia Low fat diet - Lipid panel - atorvastatin (LIPITOR) 40 MG tablet; Take 1 tablet (40 mg total) by mouth daily.  Dispense: 90 tablet; Refill: 1   4. Diabetic polyneuropathy associated with type 2 diabetes mellitus ([place]) Do not go barefooted - gabapentin (NEURONTIN) 100 MG capsule; TAKE 1 CAPSULE (100 MG TOTAL) BY MOUTH 3 (THREE) TIMES DAILY.  Dispense: 270 capsule; Refill: 1  5. GAD (generalized anxiety disorder) Stress management  6. Peripheral edema Elevate legs when sitting - furosemide (LASIX) 40 MG tablet; Take 1 tablet (40 mg total) by mouth daily.  Dispense: 90 tablet; Refill: 1  7. Depression Stress management - citalopram (CELEXA) 40 MG tablet; Take 1 tablet (40 mg total) by mouth daily.  Dispense: 90 tablet;  Refill: 1  8. Morbid obesity, unspecified obesity type ([place]) Discussed diet and exercise for person with BMI >25 Will recheck weight in 3-6 months    Labs pending Health maintenance reviewed Diet and exercise encouraged Continue all meds Follow up  In 3 months   [place], FNP

## 2015-09-07 ENCOUNTER — Ambulatory Visit (INDEPENDENT_AMBULATORY_CARE_PROVIDER_SITE_OTHER): Payer: Medicare HMO | Admitting: Nurse Practitioner

## 2015-09-07 ENCOUNTER — Encounter: Payer: Self-pay | Admitting: Nurse Practitioner

## 2015-09-07 VITALS — BP 138/72 | HR 54 | Temp 97.7°F | Ht 64.0 in

## 2015-09-07 DIAGNOSIS — F329 Major depressive disorder, single episode, unspecified: Secondary | ICD-10-CM | POA: Diagnosis not present

## 2015-09-07 DIAGNOSIS — I10 Essential (primary) hypertension: Secondary | ICD-10-CM | POA: Diagnosis not present

## 2015-09-07 DIAGNOSIS — E119 Type 2 diabetes mellitus without complications: Secondary | ICD-10-CM

## 2015-09-07 DIAGNOSIS — R609 Edema, unspecified: Secondary | ICD-10-CM | POA: Diagnosis not present

## 2015-09-07 DIAGNOSIS — F411 Generalized anxiety disorder: Secondary | ICD-10-CM | POA: Diagnosis not present

## 2015-09-07 DIAGNOSIS — Z794 Long term (current) use of insulin: Secondary | ICD-10-CM | POA: Diagnosis not present

## 2015-09-07 DIAGNOSIS — M1990 Unspecified osteoarthritis, unspecified site: Secondary | ICD-10-CM | POA: Diagnosis not present

## 2015-09-07 DIAGNOSIS — R69 Illness, unspecified: Secondary | ICD-10-CM | POA: Diagnosis not present

## 2015-09-07 DIAGNOSIS — E785 Hyperlipidemia, unspecified: Secondary | ICD-10-CM | POA: Diagnosis not present

## 2015-09-07 DIAGNOSIS — F32A Depression, unspecified: Secondary | ICD-10-CM

## 2015-09-07 LAB — BAYER DCA HB A1C WAIVED: HB A1C: 7.2 % — AB (ref <7.0)

## 2015-09-07 MED ORDER — MECLIZINE HCL 25 MG PO TABS: 25.0000 mg | ORAL_TABLET | Freq: Three times a day (TID) | ORAL | 0 refills | Status: DC | PRN

## 2015-09-07 NOTE — Patient Instructions (Signed)
Fall Prevention in the Home  Falls can cause injuries and can affect people from all age groups. There are many simple things that you can do to make your home safe and to help prevent falls. WHAT CAN I DO ON THE OUTSIDE OF MY HOME?  Regularly repair the edges of walkways and driveways and fix any cracks.  Remove high doorway thresholds.  Trim any shrubbery on the main path into your home.  Use bright outdoor lighting.  Clear walkways of debris and clutter, including tools and rocks.  Regularly check that handrails are securely fastened and in good repair. Both sides of any steps should have handrails.  Install guardrails along the edges of any raised decks or porches.  Have leaves, snow, and ice cleared regularly.  Use sand or salt on walkways during winter months.  In the garage, clean up any spills right away, including grease or oil spills. WHAT CAN I DO IN THE BATHROOM?  Use night lights.  Install grab bars by the toilet and in the tub and shower. Do not use towel bars as grab bars.  Use non-skid mats or decals on the floor of the tub or shower.  If you need to sit down while you are in the shower, use a plastic, non-slip stool..  Keep the floor dry. Immediately clean up any water that spills on the floor.  Remove soap buildup in the tub or shower on a regular basis.  Attach bath mats securely with double-sided non-slip rug tape.  Remove throw rugs and other tripping hazards from the floor. WHAT CAN I DO IN THE BEDROOM?  Use night lights.  Make sure that a bedside light is easy to reach.  Do not use oversized bedding that drapes onto the floor.  Have a firm chair that has side arms to use for getting dressed.  Remove throw rugs and other tripping hazards from the floor. WHAT CAN I DO IN THE KITCHEN?   Clean up any spills right away.  Avoid walking on wet floors.  Place frequently used items in easy-to-reach places.  If you need to reach for something  above you, use a sturdy step stool that has a grab bar.  Keep electrical cables out of the way.  Do not use floor polish or wax that makes floors slippery. If you have to use wax, make sure that it is non-skid floor wax.  Remove throw rugs and other tripping hazards from the floor. WHAT CAN I DO IN THE STAIRWAYS?  Do not leave any items on the stairs.  Make sure that there are handrails on both sides of the stairs. Fix handrails that are broken or loose. Make sure that handrails are as long as the stairways.  Check any carpeting to make sure that it is firmly attached to the stairs. Fix any carpet that is loose or worn.  Avoid having throw rugs at the top or bottom of stairways, or secure the rugs with carpet tape to prevent them from moving.  Make sure that you have a light switch at the top of the stairs and the bottom of the stairs. If you do not have them, have them installed. WHAT ARE SOME OTHER FALL PREVENTION TIPS?  Wear closed-toe shoes that fit well and support your feet. Wear shoes that have rubber soles or low heels.  When you use a stepladder, make sure that it is completely opened and that the sides are firmly locked. Have someone hold the ladder while you   are using it. Do not climb a closed stepladder.  Add color or contrast paint or tape to grab bars and handrails in your home. Place contrasting color strips on the first and last steps.  Use mobility aids as needed, such as canes, walkers, scooters, and crutches.  Turn on lights if it is dark. Replace any light bulbs that burn out.  Set up furniture so that there are clear paths. Keep the furniture in the same spot.  Fix any uneven floor surfaces.  Choose a carpet design that does not hide the edge of steps of a stairway.  Be aware of any and all pets.  Review your medicines with your healthcare provider. Some medicines can cause dizziness or changes in blood pressure, which increase your risk of falling. Talk  with your health care provider about other ways that you can decrease your risk of falls. This may include working with a physical therapist or trainer to improve your strength, balance, and endurance.   This information is not intended to replace advice given to you by your health care provider. Make sure you discuss any questions you have with your health care provider.   Document Released: 12/30/2001 Document Revised: 05/26/2014 Document Reviewed: 02/13/2014 Elsevier Interactive Patient Education 2016 [employer]  

## 2015-09-07 NOTE — Progress Notes (Signed)
Subjective:    Patient ID: [name], female    DOB: September 15, 1930, 82 y.o.   MRN: [id number]  Patient here today for follow up of chronic medical problems. Patient denies any changes. No complaints today.  Outpatient Encounter Prescriptions as of 09/07/2015  Medication Sig  . Cholecalciferol (VITAMIN D3) 2000 UNITS TABS Take 2,000 Int'l Units by mouth daily.    . citalopram (CELEXA) 40 MG tablet Take 1 tablet (40 mg total) by mouth daily.  . furosemide (LASIX) 40 MG tablet Take 1 tablet (40 mg total) by mouth daily.  [name] gabapentin (NEURONTIN) 100 MG capsule TAKE 1 CAPSULE (100 MG TOTAL) BY MOUTH 3 (THREE) TIMES DAILY.  [name] glipiZIDE (GLUCOTROL XL) 2.5 MG 24 hr tablet Take 1 tablet (2.5 mg total) by mouth daily.  . metFORMIN (GLUCOPHAGE) 1000 MG tablet Take 1 tablet (1,000 mg total) by mouth 2 (two) times daily with a meal.  . metoprolol (LOPRESSOR) 50 MG tablet Take 1 tablet (50 mg total) by mouth 2 (two) times daily.  [name] amlodipine-benazepril (LOTREL) 2.5-10 MG capsule Take 1 capsule by mouth daily. (Patient not taking: Reported on 09/07/2015)  . atorvastatin (LIPITOR) 40 MG tablet Take 1 tablet (40 mg total) by mouth daily. (Patient not taking: Reported on 09/07/2015)  . b complex vitamins tablet Take 1 tablet by mouth daily.   No facility-administered encounter medications on file as of 09/07/2015.    * Patient said that she fell in her kitchen - felt a little dizzy prior to faling. Said that it didn't hurt her.  Diabetes  She presents for her follow-up diabetic visit. She has type 2 diabetes mellitus. No MedicAlert identification noted. Her disease course has been fluctuating. There are no hypoglycemic associated symptoms. Associated symptoms include fatigue and foot paresthesias (Bilateral feet-burning sensation to plantar surface. ). Pertinent negatives for diabetes include no chest pain, no polydipsia, no polyphagia and no weakness. There are no hypoglycemic complications. Symptoms are  stable. Diabetic complications include peripheral neuropathy. Risk factors for coronary artery disease include diabetes mellitus, dyslipidemia, hypertension, obesity and post-menopausal. Current diabetic treatment includes oral agent (dual therapy). She is compliant with treatment most of the time. Her weight is stable. When asked about meal planning, she reported none. She has not had a previous visit with a dietitian. She rarely participates in exercise. There is no change in her home blood glucose trend. Her breakfast blood glucose is taken between 9-10 am. Her breakfast blood glucose range is generally 130-140 mg/dl. Her overall blood glucose range is 130-140 mg/dl. An ACE inhibitor/angiotensin II receptor blocker is being taken. She does not see a podiatrist.Eye exam is not current.  Hypertension  This is a chronic problem. The current episode started more than 1 year ago. The problem has been waxing and waning since onset. Pertinent negatives include no chest pain, orthopnea or palpitations. Risk factors for coronary artery disease include diabetes mellitus, dyslipidemia, obesity, post-menopausal state and sedentary lifestyle. Past treatments include calcium channel blockers and ACE inhibitors. The current treatment provides moderate improvement. Compliance problems include diet and exercise.  Hypertensive end-organ damage includes CAD/MI.  Hyperlipidemia  This is a chronic problem. The current episode started more than 1 year ago. The problem is controlled. Recent lipid tests were reviewed and are normal. Exacerbating diseases include diabetes and obesity. She has no history of hypothyroidism. Pertinent negatives include no chest pain. Current antihyperlipidemic treatment includes statins. The current treatment provides moderate improvement of lipids. Compliance problems include adherence to diet and  adherence to exercise.  Risk factors for coronary artery disease include diabetes mellitus, hypertension,  obesity, post-menopausal and a sedentary lifestyle.  Depression Celexa- Keeps her calm and from worrying so much Diabetic neuropathy bil legs and feet Neurotin helps with discomfort- No medication side effects Hyperkalemia Still having  a hard time getting potassiium down and have actually resorted to [facility] for a couple of days we will check levels today to see if better- patient denies chest pain or lower ext cramping.   Review of Systems  Constitutional: Positive for fatigue. Negative for unexpected weight change. Activity change: activity level has actually increased.  HENT: Positive for congestion, ear pain, postnasal drip, sinus pressure and sore throat.   Eyes: Negative.   Respiratory: Positive for cough. Negative for chest tightness and wheezing.   Cardiovascular: Negative.  Negative for chest pain, palpitations, orthopnea and leg swelling.  Gastrointestinal: Negative for abdominal pain.  Endocrine: Negative for polydipsia and polyphagia.  Genitourinary: Negative.   Musculoskeletal: Positive for arthralgias (In bilateral hands and ankles. ).  Neurological: Negative.  Negative for weakness.  Psychiatric/Behavioral: Negative.  Negative for self-injury and suicidal ideas.  All other systems reviewed and are negative.      Objective:   Physical Exam  Constitutional: She is oriented to person, place, and time. She appears well-developed and well-nourished.  HENT:  Nose: Nose normal.  Mouth/Throat: Oropharynx is clear and moist.  Bilateral cerumen impaction  Eyes: EOM are normal.  Neck: Trachea normal, normal range of motion and full passive range of motion without pain. Neck supple. No JVD present. Carotid bruit is not present. No thyromegaly present.  Cardiovascular: Normal rate, regular rhythm, normal heart sounds and intact distal pulses.  Exam reveals no gallop and no friction rub.   No murmur heard. Pulmonary/Chest: Effort normal and breath sounds normal.  Abdominal:  Soft. Bowel sounds are normal. She exhibits no distension and no mass. There is no tenderness.  Musculoskeletal: Normal range of motion. She exhibits edema (1+ edema bil lower ext).  Lymphadenopathy:    She has no cervical adenopathy.  Neurological: She is alert and oriented to person, place, and time. She has normal reflexes.  Skin: Skin is warm and dry.  Psychiatric: She has a normal mood and affect. Her behavior is normal. Judgment and thought content normal.   BP 138/72 (BP Location: Left Arm, Cuff Size: Large)   Pulse (!) 54   Temp 97.7 F (36.5 C) (Oral)   Ht _0  (1.626 m)    hgba1c-7.2%  Assessment & Plan:  1. Type 2 diabetes mellitus without complication, with long-term current use of insulin ([name]) Continue to watch carbs in diet - Bayer DCA Hb A1c Waived  2. Essential hypertension Do not add salt to diet - CMP14+EGFR  3. Arthritis Fall precautions  4. Depression Stress management  5. GAD (generalized anxiety disorder)  6. Hyperlipidemia Low fat diet - Lipid panel  7. Morbid obesity, unspecified obesity type ([place]) Discussed diet and exercise for person with BMI >25 Will recheck weight in 3-6 months  8. Peripheral edema Elevate legs when sitting    Labs pending Health maintenance reviewed Diet and exercise encouraged Continue all meds Follow up  In 3 months   [place], FNP

## 2015-09-07 NOTE — Addendum Note (Signed)
Addended by: Bennie PieriniMARTIN, MARY-MARGARET on: 09/07/2015 11:18 AM   Modules accepted: Orders

## 2015-09-08 LAB — CMP14+EGFR
ALBUMIN: 4.2 g/dL (ref 3.5–4.7)
ALK PHOS: 78 IU/L (ref 39–117)
ALT: 23 IU/L (ref 0–32)
AST: 18 IU/L (ref 0–40)
Albumin/Globulin Ratio: 1.6 (ref 1.2–2.2)
BILIRUBIN TOTAL: 0.4 mg/dL (ref 0.0–1.2)
BUN/Creatinine Ratio: 18 (ref 12–28)
BUN: 17 mg/dL (ref 8–27)
CHLORIDE: 98 mmol/L (ref 96–106)
CO2: 23 mmol/L (ref 18–29)
CREATININE: 0.97 mg/dL (ref 0.57–1.00)
Calcium: 8.9 mg/dL (ref 8.7–10.3)
GFR calc Af Amer: 63 mL/min/{1.73_m2} (ref >59)
GFR calc non Af Amer: 55 mL/min/{1.73_m2} — ABNORMAL LOW (ref >59)
GLUCOSE: 125 mg/dL — AB (ref 65–99)
Globulin, Total: 2.7 g/dL (ref 1.5–4.5)
Potassium: 4.7 mmol/L (ref 3.5–5.2)
SODIUM: 140 mmol/L (ref 134–144)
Total Protein: 6.9 g/dL (ref 6.0–8.5)

## 2015-09-08 LAB — LIPID PANEL
CHOLESTEROL TOTAL: 203 mg/dL — AB (ref 100–199)
Chol/HDL Ratio: 3.6 ratio units (ref 0.0–4.4)
HDL: 56 mg/dL (ref >39)
LDL CALC: 121 mg/dL — AB (ref 0–99)
TRIGLYCERIDES: 130 mg/dL (ref 0–149)
VLDL Cholesterol Cal: 26 mg/dL (ref 5–40)

## 2015-09-13 ENCOUNTER — Other Ambulatory Visit: Payer: Self-pay | Admitting: Nurse Practitioner

## 2015-09-13 ENCOUNTER — Telehealth: Payer: Self-pay | Admitting: Nurse Practitioner

## 2015-09-13 MED ORDER — AZITHROMYCIN 250 MG PO TABS: ORAL_TABLET | ORAL | 0 refills | Status: DC

## 2015-09-13 NOTE — Telephone Encounter (Signed)
Pt is having sore throat, deep cough, head and chest congestion. sxs started on Weds. Wants antibiotic. Please review and advise

## 2015-09-14 NOTE — Telephone Encounter (Signed)
Sent in a zpak on the 15th should stay in her system for at least 10 days

## 2015-09-15 NOTE — Telephone Encounter (Signed)
Patient aware.

## 2015-10-08 DIAGNOSIS — R69 Illness, unspecified: Secondary | ICD-10-CM | POA: Diagnosis not present

## 2015-11-26 ENCOUNTER — Encounter: Payer: Self-pay | Admitting: Nurse Practitioner

## 2015-11-26 ENCOUNTER — Ambulatory Visit (INDEPENDENT_AMBULATORY_CARE_PROVIDER_SITE_OTHER): Payer: Medicare HMO | Admitting: Nurse Practitioner

## 2015-11-26 VITALS — BP 136/71 | HR 83 | Temp 98.0°F | Ht 64.0 in | Wt 234.0 lb

## 2015-11-26 DIAGNOSIS — M7989 Other specified soft tissue disorders: Secondary | ICD-10-CM

## 2015-11-26 NOTE — Progress Notes (Signed)
   Subjective:    Patient ID: Kimberly Mcdowell, female    DOB: 02/03/1930, 82 y.o.   MRN: [id number]  HPI Patient comes in c/o of a ring stuck on her finger. inger is swelling and painful.   Review of Systems  Constitutional: Negative.   HENT: Negative.   Respiratory: Negative.   Cardiovascular: Negative.   Genitourinary: Negative.   Neurological: Negative.   Psychiatric/Behavioral: Negative.   All other systems reviewed and are negative.      Objective:   Physical Exam  Constitutional: She is oriented to person, place, and time. She appears well-developed and well-nourished.  Cardiovascular: Normal rate, regular rhythm and normal heart sounds.   Pulmonary/Chest: Effort normal and breath sounds normal.  Neurological: She is alert and oriented to person, place, and time.  Skin: Skin is warm.  Ring stuck on left middle finger  Psychiatric: She has a normal mood and affect. Her behavior is normal. Judgment and thought content normal.   Procedure soak finger in ice water- then sprayed with copious amounts of windex and ring was removed.       Assessment & Plan:  1. Swollen finger Do not put ring back on finger RTO prn  Mary-Margaret Daphine DeutscherMartin, FNP

## 2015-12-03 ENCOUNTER — Other Ambulatory Visit: Payer: Self-pay | Admitting: Nurse Practitioner

## 2015-12-03 DIAGNOSIS — E1142 Type 2 diabetes mellitus with diabetic polyneuropathy: Secondary | ICD-10-CM

## 2015-12-14 ENCOUNTER — Ambulatory Visit: Payer: Medicare HMO | Admitting: Nurse Practitioner

## 2016-02-24 ENCOUNTER — Encounter: Payer: Self-pay | Admitting: Nurse Practitioner

## 2016-02-24 ENCOUNTER — Ambulatory Visit (INDEPENDENT_AMBULATORY_CARE_PROVIDER_SITE_OTHER): Payer: Medicare HMO | Admitting: Nurse Practitioner

## 2016-02-24 VITALS — BP 146/84 | HR 62 | Temp 97.2°F | Ht 64.0 in | Wt 234.0 lb

## 2016-02-24 DIAGNOSIS — E782 Mixed hyperlipidemia: Secondary | ICD-10-CM | POA: Diagnosis not present

## 2016-02-24 DIAGNOSIS — F410 Panic disorder [episodic paroxysmal anxiety] without agoraphobia: Secondary | ICD-10-CM | POA: Diagnosis not present

## 2016-02-24 DIAGNOSIS — Z794 Long term (current) use of insulin: Secondary | ICD-10-CM

## 2016-02-24 DIAGNOSIS — E119 Type 2 diabetes mellitus without complications: Secondary | ICD-10-CM

## 2016-02-24 DIAGNOSIS — E1142 Type 2 diabetes mellitus with diabetic polyneuropathy: Secondary | ICD-10-CM | POA: Diagnosis not present

## 2016-02-24 DIAGNOSIS — F411 Generalized anxiety disorder: Secondary | ICD-10-CM

## 2016-02-24 DIAGNOSIS — F3342 Major depressive disorder, recurrent, in full remission: Secondary | ICD-10-CM

## 2016-02-24 DIAGNOSIS — I10 Essential (primary) hypertension: Secondary | ICD-10-CM | POA: Diagnosis not present

## 2016-02-24 DIAGNOSIS — R69 Illness, unspecified: Secondary | ICD-10-CM | POA: Diagnosis not present

## 2016-02-24 DIAGNOSIS — R609 Edema, unspecified: Secondary | ICD-10-CM

## 2016-02-24 LAB — BAYER DCA HB A1C WAIVED: HB A1C: 7.2 % — AB (ref <7.0)

## 2016-02-24 MED ORDER — METOPROLOL TARTRATE 50 MG PO TABS: 50.0000 mg | ORAL_TABLET | Freq: Two times a day (BID) | ORAL | 1 refills | Status: DC

## 2016-02-24 MED ORDER — METFORMIN HCL 1000 MG PO TABS: 1000.0000 mg | ORAL_TABLET | Freq: Two times a day (BID) | ORAL | 1 refills | Status: DC

## 2016-02-24 MED ORDER — GABAPENTIN 100 MG PO CAPS: 100.0000 mg | ORAL_CAPSULE | Freq: Three times a day (TID) | ORAL | 1 refills | Status: DC

## 2016-02-24 MED ORDER — ALPRAZOLAM 0.25 MG PO TABS: 0.2500 mg | ORAL_TABLET | Freq: Every day | ORAL | 2 refills | Status: DC

## 2016-02-24 MED ORDER — CITALOPRAM HYDROBROMIDE 40 MG PO TABS: 40.0000 mg | ORAL_TABLET | Freq: Every day | ORAL | 1 refills | Status: DC

## 2016-02-24 MED ORDER — FUROSEMIDE 40 MG PO TABS: 40.0000 mg | ORAL_TABLET | Freq: Every day | ORAL | 1 refills | Status: DC

## 2016-02-24 MED ORDER — AMLODIPINE BESY-BENAZEPRIL HCL 2.5-10 MG PO CAPS: 1.0000 | ORAL_CAPSULE | Freq: Every day | ORAL | 1 refills | Status: DC

## 2016-02-24 MED ORDER — ATORVASTATIN CALCIUM 40 MG PO TABS: 40.0000 mg | ORAL_TABLET | Freq: Every day | ORAL | 1 refills | Status: DC

## 2016-02-24 MED ORDER — GLIPIZIDE ER 2.5 MG PO TB24: 2.5000 mg | ORAL_TABLET | Freq: Every day | ORAL | 1 refills | Status: DC

## 2016-02-24 NOTE — Progress Notes (Signed)
Subjective:    Patient ID: Kimberly Mcdowell, female    DOB: [birth year], 83 y.o.   MRN: [id number]  Patient here today for follow up of chronic medical problems. Patient denies any changes. No complaints today.  Outpatient Encounter Prescriptions as of 02/24/2016  Medication Sig  . amlodipine-benazepril (LOTREL) 2.5-10 MG capsule Take 1 capsule by mouth daily. (Patient not taking: Reported on 09/07/2015)  . atorvastatin (LIPITOR) 40 MG tablet Take 1 tablet (40 mg total) by mouth daily. (Patient not taking: Reported on 09/07/2015)  . b complex vitamins tablet Take 1 tablet by mouth daily.  . Cholecalciferol (VITAMIN D3) 2000 UNITS TABS Take 2,000 Int'l Units by mouth daily.    . citalopram (CELEXA) 40 MG tablet Take 1 tablet (40 mg total) by mouth daily.  . furosemide (LASIX) 40 MG tablet Take 1 tablet (40 mg total) by mouth daily.  [name] gabapentin (NEURONTIN) 100 MG capsule TAKE ONE CAPSULE BY MOUTH THREE TIMES DAILY  . glipiZIDE (GLUCOTROL XL) 2.5 MG 24 hr tablet Take 1 tablet (2.5 mg total) by mouth daily.  . meclizine (ANTIVERT) 25 MG tablet Take 1 tablet (25 mg total) by mouth 3 (three) times daily as needed for dizziness.  . metFORMIN (GLUCOPHAGE) 1000 MG tablet Take 1 tablet (1,000 mg total) by mouth 2 (two) times daily with a meal.  . metoprolol (LOPRESSOR) 50 MG tablet Take 1 tablet (50 mg total) by mouth 2 (two) times daily.   No facility-administered encounter medications on file as of 02/24/2016.    Diabetes  She presents for her follow-up diabetic visit. She has type 2 diabetes mellitus. No MedicAlert identification noted. Her disease course has been fluctuating. There are no hypoglycemic associated symptoms. Associated symptoms include fatigue and foot paresthesias (Bilateral feet-burning sensation to plantar surface. ). Pertinent negatives for diabetes include no chest pain, no polydipsia, no polyphagia and no weakness. There are no hypoglycemic complications. Symptoms are stable. Diabetic  complications include peripheral neuropathy. Risk factors for coronary artery disease include diabetes mellitus, dyslipidemia, hypertension, obesity and post-menopausal. Current diabetic treatment includes oral agent (dual therapy). She is compliant with treatment most of the time. Her weight is stable. When asked about meal planning, she reported none. She has not had a previous visit with a dietitian. She rarely participates in exercise. There is no change in her home blood glucose trend. Her breakfast blood glucose is taken between 9-10 am. Her breakfast blood glucose range is generally 130-140 mg/dl. Her overall blood glucose range is 130-140 mg/dl. An ACE inhibitor/angiotensin II receptor blocker is being taken. She does not see a podiatrist.Eye exam is not current.  Hypertension  This is a chronic problem. The current episode started more than 1 year ago. The problem has been waxing and waning since onset. Pertinent negatives include no chest pain, orthopnea or palpitations. Risk factors for coronary artery disease include diabetes mellitus, dyslipidemia, obesity, post-menopausal state and sedentary lifestyle. Past treatments include calcium channel blockers and ACE inhibitors. The current treatment provides moderate improvement. Compliance problems include diet and exercise.  Hypertensive end-organ damage includes CAD/MI.  Hyperlipidemia  This is a chronic problem. The current episode started more than 1 year ago. The problem is controlled. Recent lipid tests were reviewed and are normal. Exacerbating diseases include diabetes and obesity. She has no history of hypothyroidism. Pertinent negatives include no chest pain. Current antihyperlipidemic treatment includes statins. The current treatment provides moderate improvement of lipids. Compliance problems include adherence to diet and adherence to exercise.  Risk  factors for coronary artery disease include diabetes mellitus, hypertension, obesity,  post-menopausal and a sedentary lifestyle.  Depression Celexa- Keeps her calm and from worrying so much Diabetic neuropathy bil legs and feet Neurotin helps with discomfort- No medication side effects Hyperkalemia Still having  a hard time getting potassiium down and have actually resorted to [facility] for a couple of days we will check levels today to see if better- patient denies chest pain or lower ext cramping. Panic attacks at night Having to sleep with light on- gets sob , shaky and heart racing.   Review of Systems  Constitutional: Positive for fatigue. Negative for unexpected weight change. Activity change: activity level has actually increased.  HENT: Positive for congestion, ear pain, postnasal drip, sinus pressure and sore throat.   Eyes: Negative.   Respiratory: Positive for cough. Negative for chest tightness and wheezing.   Cardiovascular: Negative.  Negative for chest pain, palpitations, orthopnea and leg swelling.  Gastrointestinal: Negative for abdominal pain.  Endocrine: Negative for polydipsia and polyphagia.  Genitourinary: Negative.   Musculoskeletal: Positive for arthralgias (In bilateral hands and ankles. ).  Neurological: Negative.  Negative for weakness.  Psychiatric/Behavioral: Negative.  Negative for self-injury and suicidal ideas.  All other systems reviewed and are negative.      Objective:   Physical Exam  Constitutional: She is oriented to person, place, and time. She appears well-developed and well-nourished.  HENT:  Nose: Nose normal.  Mouth/Throat: Oropharynx is clear and moist.  Bilateral cerumen impaction  Eyes: EOM are normal.  Neck: Trachea normal, normal range of motion and full passive range of motion without pain. Neck supple. No JVD present. Carotid bruit is not present. No thyromegaly present.  Cardiovascular: Normal rate, regular rhythm, normal heart sounds and intact distal pulses.  Exam reveals no gallop and no friction rub.   No  murmur heard. Pulmonary/Chest: Effort normal and breath sounds normal.  Abdominal: Soft. Bowel sounds are normal. She exhibits no distension and no mass. There is no tenderness.  Musculoskeletal: Normal range of motion. She exhibits edema (1+ edema bil lower ext).  Lymphadenopathy:    She has no cervical adenopathy.  Neurological: She is alert and oriented to person, place, and time. She has normal reflexes.  Skin: Skin is warm and dry.  Psychiatric: She has a normal mood and affect. Her behavior is normal. Judgment and thought content normal.   BP (!) 146/84 (BP Location: Left Arm, Cuff Size: Large)   Pulse 62   Temp 97.2 F (36.2 C) (Oral)   Ht 5\' 4"  (1.626 m)   Wt 234 lb (106.1 kg)   BMI 40.17 kg/m    hgba1c-7.2%  Assessment & Plan:  1. Essential hypertension *low sodium diet - metoprolol (LOPRESSOR) 50 MG tablet; Take 1 tablet (50 mg total) by mouth 2 (two) times daily.  Dispense: 180 tablet; Refill: 1 - amlodipine-benazepril (LOTREL) 2.5-10 MG capsule; Take 1 capsule by mouth daily. (Patient not taking: Reported on 02/24/2016)  Dispense: 90 capsule; Refill: 1 - CMP14+EGFR  2. Type 2 diabetes mellitus without complication, with long-term current use of insulin ([name]) Continue to watch carbs in diet - glipiZIDE (GLUCOTROL XL) 2.5 MG 24 hr tablet; Take 1 tablet (2.5 mg total) by mouth daily.  Dispense: 90 tablet; Refill: 1 - metFORMIN (GLUCOPHAGE) 1000 MG tablet; Take 1 tablet (1,000 mg total) by mouth 2 (two) times daily with a meal.  Dispense: 180 tablet; Refill: 1 - Bayer DCA Hb A1c Waived  3. Diabetic polyneuropathy associated  with type 2 diabetes mellitus ([place]) Do not go barefooted - gabapentin (NEURONTIN) 100 MG capsule; Take 1 capsule (100 mg total) by mouth 3 (three) times daily.  Dispense: 270 capsule; Refill: 1  4. Recurrent major depressive disorder, in full remission ([place]) Stress management - citalopram (CELEXA) 40 MG tablet; Take 1 tablet (40 mg total) by mouth  daily.  Dispense: 90 tablet; Refill: 1  5. GAD (generalized anxiety disorder)  6. Mixed hyperlipidemia Low fat diet - atorvastatin (LIPITOR) 40 MG tablet; Take 1 tablet (40 mg total) by mouth daily. (Patient not taking: Reported on 02/24/2016)  Dispense: 90 tablet; Refill: 1 - Lipid panel  7. Morbid obesity ([place]) Discussed diet and exercise for person with BMI >25 Will recheck weight in 3-6 months  8. Peripheral edema Elevate legs when sitting - furosemide (LASIX) 40 MG tablet; Take 1 tablet (40 mg total) by mouth daily.  Dispense: 90 tablet; Refill: 1  9. Panic attacks Xanax 0.63m 1 po QHS #30 2 refills  Encouraged  To make appointment fo reye exam Labs pending Health maintenance reviewed Diet and exercise encouraged Continue all meds Follow up  In 3 months   MStiles FNP

## 2016-02-24 NOTE — Patient Instructions (Signed)
Fall Prevention in the Home Introduction Falls can cause injuries. They can happen to people of all ages. There are many things you can do to make your home safe and to help prevent falls. What can I do on the outside of my home?  Regularly fix the edges of walkways and driveways and fix any cracks.  Remove anything that might make you trip as you walk through a door, such as a raised step or threshold.  Trim any bushes or trees on the path to your home.  Use bright outdoor lighting.  Clear any walking paths of anything that might make someone trip, such as rocks or tools.  Regularly check to see if handrails are loose or broken. Make sure that both sides of any steps have handrails.  Any raised decks and porches should have guardrails on the edges.  Have any leaves, snow, or ice cleared regularly.  Use sand or salt on walking paths during winter.  Clean up any spills in your garage right away. This includes oil or grease spills. What can I do in the bathroom?  Use night lights.  Install grab bars by the toilet and in the tub and shower. Do not use towel bars as grab bars.  Use non-skid mats or decals in the tub or shower.  If you need to sit down in the shower, use a plastic, non-slip stool.  Keep the floor dry. Clean up any water that spills on the floor as soon as it happens.  Remove soap buildup in the tub or shower regularly.  Attach bath mats securely with double-sided non-slip rug tape.  Do not have throw rugs and other things on the floor that can make you trip. What can I do in the bedroom?  Use night lights.  Make sure that you have a light by your bed that is easy to reach.  Do not use any sheets or blankets that are too big for your bed. They should not hang down onto the floor.  Have a firm chair that has side arms. You can use this for support while you get dressed.  Do not have throw rugs and other things on the floor that can make you trip. What can  I do in the kitchen?  Clean up any spills right away.  Avoid walking on wet floors.  Keep items that you use a lot in easy-to-reach places.  If you need to reach something above you, use a strong step stool that has a grab bar.  Keep electrical cords out of the way.  Do not use floor polish or wax that makes floors slippery. If you must use wax, use non-skid floor wax.  Do not have throw rugs and other things on the floor that can make you trip. What can I do with my stairs?  Do not leave any items on the stairs.  Make sure that there are handrails on both sides of the stairs and use them. Fix handrails that are broken or loose. Make sure that handrails are as long as the stairways.  Check any carpeting to make sure that it is firmly attached to the stairs. Fix any carpet that is loose or worn.  Avoid having throw rugs at the top or bottom of the stairs. If you do have throw rugs, attach them to the floor with carpet tape.  Make sure that you have a light switch at the top of the stairs and the bottom of the stairs. If you   do not have them, ask someone to add them for you. What else can I do to help prevent falls?  Wear shoes that:  Do not have high heels.  Have rubber bottoms.  Are comfortable and fit you well.  Are closed at the toe. Do not wear sandals.  If you use a stepladder:  Make sure that it is fully opened. Do not climb a closed stepladder.  Make sure that both sides of the stepladder are locked into place.  Ask someone to hold it for you, if possible.  Clearly mark and make sure that you can see:  Any grab bars or handrails.  First and last steps.  Where the edge of each step is.  Use tools that help you move around (mobility aids) if they are needed. These include:  Canes.  Walkers.  Scooters.  Crutches.  Turn on the lights when you go into a dark area. Replace any light bulbs as soon as they burn out.  Set up your furniture so you have a  clear path. Avoid moving your furniture around.  If any of your floors are uneven, fix them.  If there are any pets around you, be aware of where they are.  Review your medicines with your doctor. Some medicines can make you feel dizzy. This can increase your chance of falling. Ask your doctor what other things that you can do to help prevent falls. This information is not intended to replace advice given to you by your health care provider. Make sure you discuss any questions you have with your health care provider. Document Released: 11/05/2008 Document Revised: 06/17/2015 Document Reviewed: 02/13/2014  2017 Elsevier  

## 2016-02-25 LAB — CMP14+EGFR
ALK PHOS: 75 IU/L (ref 39–117)
ALT: 19 IU/L (ref 0–32)
AST: 18 IU/L (ref 0–40)
Albumin/Globulin Ratio: 1.6 (ref 1.2–2.2)
Albumin: 4.4 g/dL (ref 3.5–4.7)
BILIRUBIN TOTAL: 0.3 mg/dL (ref 0.0–1.2)
BUN/Creatinine Ratio: 15 (ref 12–28)
BUN: 14 mg/dL (ref 8–27)
CHLORIDE: 99 mmol/L (ref 96–106)
CO2: 25 mmol/L (ref 18–29)
Calcium: 9.3 mg/dL (ref 8.7–10.3)
Creatinine, Ser: 0.95 mg/dL (ref 0.57–1.00)
GFR calc Af Amer: 64 mL/min/{1.73_m2} (ref >59)
GFR calc non Af Amer: 56 mL/min/{1.73_m2} — ABNORMAL LOW (ref >59)
GLUCOSE: 156 mg/dL — AB (ref 65–99)
Globulin, Total: 2.7 g/dL (ref 1.5–4.5)
Potassium: 5 mmol/L (ref 3.5–5.2)
Sodium: 140 mmol/L (ref 134–144)
TOTAL PROTEIN: 7.1 g/dL (ref 6.0–8.5)

## 2016-02-25 LAB — LIPID PANEL
Chol/HDL Ratio: 4.4 ratio units (ref 0.0–4.4)
Cholesterol, Total: 226 mg/dL — ABNORMAL HIGH (ref 100–199)
HDL: 51 mg/dL (ref >39)
LDL Calculated: 140 mg/dL — ABNORMAL HIGH (ref 0–99)
Triglycerides: 177 mg/dL — ABNORMAL HIGH (ref 0–149)
VLDL Cholesterol Cal: 35 mg/dL (ref 5–40)

## 2016-03-02 ENCOUNTER — Encounter: Payer: Self-pay | Admitting: *Deleted

## 2016-04-10 DIAGNOSIS — I110 Hypertensive heart disease with heart failure: Secondary | ICD-10-CM | POA: Diagnosis not present

## 2016-04-10 DIAGNOSIS — I509 Heart failure, unspecified: Secondary | ICD-10-CM | POA: Diagnosis not present

## 2016-04-10 DIAGNOSIS — M1310 Monoarthritis, not elsewhere classified, unspecified site: Secondary | ICD-10-CM | POA: Diagnosis not present

## 2016-04-10 DIAGNOSIS — R69 Illness, unspecified: Secondary | ICD-10-CM | POA: Diagnosis not present

## 2016-04-10 DIAGNOSIS — Z972 Presence of dental prosthetic device (complete) (partial): Secondary | ICD-10-CM | POA: Diagnosis not present

## 2016-04-10 DIAGNOSIS — E785 Hyperlipidemia, unspecified: Secondary | ICD-10-CM | POA: Diagnosis not present

## 2016-04-10 DIAGNOSIS — Z7984 Long term (current) use of oral hypoglycemic drugs: Secondary | ICD-10-CM | POA: Diagnosis not present

## 2016-04-10 DIAGNOSIS — E1142 Type 2 diabetes mellitus with diabetic polyneuropathy: Secondary | ICD-10-CM | POA: Diagnosis not present

## 2016-04-10 DIAGNOSIS — I499 Cardiac arrhythmia, unspecified: Secondary | ICD-10-CM | POA: Diagnosis not present

## 2016-04-10 DIAGNOSIS — R002 Palpitations: Secondary | ICD-10-CM | POA: Diagnosis not present

## 2016-04-10 DIAGNOSIS — Z0000 Encounter for general adult medical examination without abnormal findings: Secondary | ICD-10-CM | POA: Diagnosis not present

## 2016-04-10 DIAGNOSIS — K08409 Partial loss of teeth, unspecified cause, unspecified class: Secondary | ICD-10-CM | POA: Diagnosis not present

## 2016-04-10 DIAGNOSIS — Z87891 Personal history of nicotine dependence: Secondary | ICD-10-CM | POA: Diagnosis not present

## 2016-04-10 DIAGNOSIS — Z79899 Other long term (current) drug therapy: Secondary | ICD-10-CM | POA: Diagnosis not present

## 2016-05-19 ENCOUNTER — Other Ambulatory Visit: Payer: Self-pay | Admitting: Nurse Practitioner

## 2016-05-19 DIAGNOSIS — F410 Panic disorder [episodic paroxysmal anxiety] without agoraphobia: Secondary | ICD-10-CM

## 2016-05-22 NOTE — Telephone Encounter (Signed)
Please call in alprazolam with 1 refills 

## 2016-05-23 ENCOUNTER — Ambulatory Visit: Payer: Medicare HMO | Admitting: Nurse Practitioner

## 2016-06-13 ENCOUNTER — Other Ambulatory Visit: Payer: Self-pay | Admitting: Nurse Practitioner

## 2016-06-13 DIAGNOSIS — E119 Type 2 diabetes mellitus without complications: Secondary | ICD-10-CM

## 2016-06-13 DIAGNOSIS — E1142 Type 2 diabetes mellitus with diabetic polyneuropathy: Secondary | ICD-10-CM

## 2016-06-13 DIAGNOSIS — Z794 Long term (current) use of insulin: Secondary | ICD-10-CM

## 2016-06-13 DIAGNOSIS — I10 Essential (primary) hypertension: Secondary | ICD-10-CM

## 2016-06-15 ENCOUNTER — Other Ambulatory Visit: Payer: Self-pay | Admitting: Nurse Practitioner

## 2016-06-15 DIAGNOSIS — F3342 Major depressive disorder, recurrent, in full remission: Secondary | ICD-10-CM

## 2016-06-15 DIAGNOSIS — E782 Mixed hyperlipidemia: Secondary | ICD-10-CM

## 2016-07-25 ENCOUNTER — Other Ambulatory Visit: Payer: Self-pay | Admitting: Nurse Practitioner

## 2016-07-25 DIAGNOSIS — F410 Panic disorder [episodic paroxysmal anxiety] without agoraphobia: Secondary | ICD-10-CM

## 2016-08-08 ENCOUNTER — Ambulatory Visit (INDEPENDENT_AMBULATORY_CARE_PROVIDER_SITE_OTHER): Payer: Medicare HMO | Admitting: Nurse Practitioner

## 2016-08-08 ENCOUNTER — Encounter: Payer: Self-pay | Admitting: Nurse Practitioner

## 2016-08-08 VITALS — BP 172/91 | HR 63 | Temp 98.0°F | Ht 64.0 in | Wt 239.0 lb

## 2016-08-08 DIAGNOSIS — E119 Type 2 diabetes mellitus without complications: Secondary | ICD-10-CM | POA: Diagnosis not present

## 2016-08-08 DIAGNOSIS — E1142 Type 2 diabetes mellitus with diabetic polyneuropathy: Secondary | ICD-10-CM | POA: Diagnosis not present

## 2016-08-08 DIAGNOSIS — F411 Generalized anxiety disorder: Secondary | ICD-10-CM

## 2016-08-08 DIAGNOSIS — E782 Mixed hyperlipidemia: Secondary | ICD-10-CM | POA: Diagnosis not present

## 2016-08-08 DIAGNOSIS — Z794 Long term (current) use of insulin: Secondary | ICD-10-CM

## 2016-08-08 DIAGNOSIS — I10 Essential (primary) hypertension: Secondary | ICD-10-CM | POA: Diagnosis not present

## 2016-08-08 DIAGNOSIS — F3342 Major depressive disorder, recurrent, in full remission: Secondary | ICD-10-CM

## 2016-08-08 DIAGNOSIS — R609 Edema, unspecified: Secondary | ICD-10-CM | POA: Diagnosis not present

## 2016-08-08 DIAGNOSIS — F410 Panic disorder [episodic paroxysmal anxiety] without agoraphobia: Secondary | ICD-10-CM | POA: Diagnosis not present

## 2016-08-08 DIAGNOSIS — M1990 Unspecified osteoarthritis, unspecified site: Secondary | ICD-10-CM | POA: Diagnosis not present

## 2016-08-08 DIAGNOSIS — R69 Illness, unspecified: Secondary | ICD-10-CM | POA: Diagnosis not present

## 2016-08-08 LAB — BAYER DCA HB A1C WAIVED: HB A1C: 9.1 % — AB (ref <7.0)

## 2016-08-08 LAB — LIPID PANEL
CHOL/HDL RATIO: 3.1 ratio (ref 0.0–4.4)
Cholesterol, Total: 126 mg/dL (ref 100–199)
HDL: 41 mg/dL (ref >39)
LDL CALC: 58 mg/dL (ref 0–99)
TRIGLYCERIDES: 133 mg/dL (ref 0–149)
VLDL Cholesterol Cal: 27 mg/dL (ref 5–40)

## 2016-08-08 LAB — CMP14+EGFR
A/G RATIO: 1.5 (ref 1.2–2.2)
ALT: 24 IU/L (ref 0–32)
AST: 25 IU/L (ref 0–40)
Albumin: 4.2 g/dL (ref 3.5–4.7)
Alkaline Phosphatase: 76 IU/L (ref 39–117)
BUN/Creatinine Ratio: 16 (ref 12–28)
BUN: 17 mg/dL (ref 8–27)
Bilirubin Total: 0.4 mg/dL (ref 0.0–1.2)
CALCIUM: 9.2 mg/dL (ref 8.7–10.3)
CO2: 23 mmol/L (ref 20–29)
CREATININE: 1.09 mg/dL — AB (ref 0.57–1.00)
Chloride: 101 mmol/L (ref 96–106)
GFR, EST AFRICAN AMERICAN: 54 mL/min/{1.73_m2} — AB (ref >59)
GFR, EST NON AFRICAN AMERICAN: 47 mL/min/{1.73_m2} — AB (ref >59)
GLOBULIN, TOTAL: 2.8 g/dL (ref 1.5–4.5)
Glucose: 173 mg/dL — ABNORMAL HIGH (ref 65–99)
POTASSIUM: 5 mmol/L (ref 3.5–5.2)
Sodium: 140 mmol/L (ref 134–144)
TOTAL PROTEIN: 7 g/dL (ref 6.0–8.5)

## 2016-08-08 MED ORDER — AMLODIPINE BESY-BENAZEPRIL HCL 2.5-10 MG PO CAPS: 1.0000 | ORAL_CAPSULE | Freq: Every day | ORAL | 1 refills | Status: DC

## 2016-08-08 MED ORDER — CITALOPRAM HYDROBROMIDE 40 MG PO TABS: 40.0000 mg | ORAL_TABLET | Freq: Every day | ORAL | 1 refills | Status: DC

## 2016-08-08 MED ORDER — FUROSEMIDE 40 MG PO TABS: 40.0000 mg | ORAL_TABLET | Freq: Every day | ORAL | 1 refills | Status: DC

## 2016-08-08 MED ORDER — GLIPIZIDE 5 MG PO TABS: 5.0000 mg | ORAL_TABLET | Freq: Two times a day (BID) | ORAL | 3 refills | Status: DC

## 2016-08-08 MED ORDER — ATORVASTATIN CALCIUM 40 MG PO TABS: 40.0000 mg | ORAL_TABLET | Freq: Every day | ORAL | 1 refills | Status: DC

## 2016-08-08 MED ORDER — ALPRAZOLAM 0.25 MG PO TABS: 0.2500 mg | ORAL_TABLET | Freq: Every day | ORAL | 1 refills | Status: DC

## 2016-08-08 MED ORDER — METOPROLOL TARTRATE 50 MG PO TABS: 50.0000 mg | ORAL_TABLET | Freq: Two times a day (BID) | ORAL | 1 refills | Status: DC

## 2016-08-08 MED ORDER — GABAPENTIN 100 MG PO CAPS: 100.0000 mg | ORAL_CAPSULE | Freq: Three times a day (TID) | ORAL | 1 refills | Status: DC

## 2016-08-08 NOTE — Progress Notes (Signed)
Subjective:    Patient ID: Kimberly Mcdowell, female    DOB: 07-28-1930, 83 y.o.   MRN: [id number]  HPI Kimberly Mcdowell is here today for follow up of chronic medical problem.  Outpatient Encounter Prescriptions as of 08/08/2016  Medication Sig  . ALPRAZolam (XANAX) 0.25 MG tablet Take 1 Tablet by mouth at bedtime  . amlodipine-benazepril (LOTREL) 2.5-10 MG capsule Take 1 capsule by mouth daily. (Patient not taking: Reported on 02/24/2016)  . atorvastatin (LIPITOR) 40 MG tablet Take 1 tablet (40 mg total) by mouth daily. (Patient not taking: Reported on 02/24/2016)  . Cholecalciferol (VITAMIN D3) 2000 UNITS TABS Take 2,000 Int'l Units by mouth daily.    . citalopram (CELEXA) 40 MG tablet Take 1 tablet (40 mg total) by mouth daily.  . furosemide (LASIX) 40 MG tablet Take 1 tablet (40 mg total) by mouth daily.  [name] gabapentin (NEURONTIN) 100 MG capsule Take 1 capsule (100 mg total) by mouth 3 (three) times daily.  [name] glipiZIDE (GLUCOTROL XL) 2.5 MG 24 hr tablet Take 1 tablet (2.5 mg total) by mouth daily.  . meclizine (ANTIVERT) 25 MG tablet Take 1 tablet (25 mg total) by mouth 3 (three) times daily as needed for dizziness.  . metFORMIN (GLUCOPHAGE) 1000 MG tablet Take 1 tablet (1,000 mg total) by mouth 2 (two) times daily with a meal.  . metoprolol (LOPRESSOR) 50 MG tablet Take 1 tablet (50 mg total) by mouth 2 (two) times daily.   No facility-administered encounter medications on file as of 08/08/2016.     1. Essential hypertension  Blood pressure well-managed with combination Lotrel and metoprolol.    2. Type 2 diabetes mellitus without complication, with long-term current use of insulin ([name])  Managed with glipizide and metformin.    Last A1C was 7.2% on 02/24/16.  Patient checks blood glucose rarely at home.  3. Mixed hyperlipidemia  Patient taking atorvastatin for management.  4. Diabetic polyneuropathy associated with type 2 diabetes mellitus ([name])  Symptoms managed with gabapentin.  No  current concerns.  5. Arthritis  No effect on daily activities.  6. GAD (generalized anxiety disorder)  Managed with Xanax.  No current daily stressors.  7. Peripheral edema  Patient taking Lasix daily and elevating legs when seated.  8. Recurrent major depressive disorder, in full remission ([name])  Symptoms managed with citaloprom.  9. Morbid obesity ([name])  No recent weight gain or loss.    New complaints: Patient having stomach ache and diarrhea this morning.  Patient states she has fallen 5 times in the past year.  Once was getting out of the shower, once her knee gave out, and once her foot got tangled in a rug in her bathroom.    She is having lower ext cramping at night and wonders if it is coming from her lipitor  Social history: Lives at home by herself- says that she is ready to go to heaven- feels good she just says she is ready to go!   Review of Systems  Constitutional: Negative for activity change, appetite change and fatigue.  Respiratory: Negative for shortness of breath and wheezing.   Cardiovascular: Negative for chest pain and palpitations.  Gastrointestinal: Negative for abdominal distention and abdominal pain.  Neurological: Negative for dizziness, light-headedness and headaches.  All other systems reviewed and are negative.      Objective:   Physical Exam  Constitutional: She is oriented to person, place, and time. She appears well-developed and well-nourished. No distress.  HENT:  Head: Normocephalic.  Right Ear: External ear normal.  Left Ear: External ear normal.  Mouth/Throat: Oropharynx is clear and moist.  Eyes: Pupils are equal, round, and reactive to light.  Neck: Normal range of motion. Neck supple. No JVD present. No thyromegaly present.  Cardiovascular: Normal rate, regular rhythm, normal heart sounds and intact distal pulses.   No murmur heard. Pulmonary/Chest: Effort normal and breath sounds normal. No respiratory distress. She has no  wheezes.  Abdominal: Soft. Bowel sounds are normal. She exhibits no distension. There is no tenderness.  Musculoskeletal: Normal range of motion. She exhibits edema (non pitting edema in bilateral lower extremities).  Lymphadenopathy:    She has no cervical adenopathy.  Neurological: She is alert and oriented to person, place, and time.  Skin: Skin is warm and dry.  Psychiatric: She has a normal mood and affect. Her behavior is normal. Judgment and thought content normal.   BP (!) 172/91   Pulse 63   Temp 98 F (36.7 C) (Oral)   Ht 5' 4" (1.626 m)   Wt 239 lb (108.4 kg)   BMI 41.02 kg/m  A1C: 9.1%      Assessment & Plan:  1. Essential hypertension Low sodium diet - CMP14+EGFR - metoprolol tartrate (LOPRESSOR) 50 MG tablet; Take 1 tablet (50 mg total) by mouth 2 (two) times daily.  Dispense: 180 tablet; Refill: 1 - amlodipine-benazepril (LOTREL) 2.5-10 MG capsule; Take 1 capsule by mouth daily.  Dispense: 90 capsule; Refill: 1  2. Type 2 diabetes mellitus without complication, with long-term current use of insulin ([name]) Stricter carb counting increased gliizide- watch for hypoglycemia - Bayer DCA Hb A1c Waived - Microalbumin / creatinine urine ratio - glipiZIDE (GLUCOTROL) 5 MG tablet; Take 1 tablet (5 mg total) by mouth 2 (two) times daily before a meal.  Dispense: 60 tablet; Refill: 3  3. Mixed hyperlipidemia Low fat diet - Lipid panel - atorvastatin (LIPITOR) 40 MG tablet; Take 1 tablet (40 mg total) by mouth daily.  Dispense: 90 tablet; Refill: 1  4. Diabetic polyneuropathy associated with type 2 diabetes mellitus ([name]) Do not go barefooted - gabapentin (NEURONTIN) 100 MG capsule; Take 1 capsule (100 mg total) by mouth 3 (three) times daily.  Dispense: 270 capsule; Refill: 1  5. Arthritis  6. GAD (generalized anxiety disorder) Stress management  7. Peripheral edema Elevate legs when sitting - furosemide (LASIX) 40 MG tablet; Take 1 tablet (40 mg total) by mouth  daily.  Dispense: 90 tablet; Refill: 1  8. Recurrent major depressive disorder, in full remission ([name]) - citalopram (CELEXA) 40 MG tablet; Take 1 tablet (40 mg total) by mouth daily.  Dispense: 90 tablet; Refill: 1  9. Morbid obesity ([place]) Discussed diet and exercise for person with BMI >25 Will recheck weight in 3-6 months  10. Panic attacks - ALPRAZolam (XANAX) 0.25 MG tablet; Take 1 tablet (0.25 mg total) by mouth at bedtime.  Dispense: 30 tablet; Refill: 1   Will make appointment for eye exam Labs pending Health maintenance reviewed Diet and exercise encouraged Continue all meds Follow up  In 3 months   [place], FNP

## 2016-08-08 NOTE — Patient Instructions (Signed)
Hypoglycemia Hypoglycemia is when the sugar (glucose) level in the blood is too low. Symptoms of low blood sugar may include:  Feeling: ? Hungry. ? Worried or nervous (anxious). ? Sweaty and clammy. ? Confused. ? Dizzy. ? Sleepy. ? Sick to your stomach (nauseous).  Having: ? A fast heartbeat. ? A headache. ? A change in your vision. ? Jerky movements that you cannot control (seizure). ? Nightmares. ? Tingling or no feeling (numbness) around the mouth, lips, or tongue.  Having trouble with: ? Talking. ? Paying attention (concentrating). ? Moving (coordination). ? Sleeping.  Shaking.  Passing out (fainting).  Getting upset easily (irritability).  Low blood sugar can happen to people who have diabetes and people who do not have diabetes. Low blood sugar can happen quickly, and it can be an emergency. Treating Low Blood Sugar Low blood sugar is often treated by eating or drinking something sugary right away. If you can think clearly and swallow safely, follow the 15:15 rule:  Take 15 grams of a fast-acting carb (carbohydrate). Some fast-acting carbs are: ? 1 tube of glucose gel. ? 3 sugar tablets (glucose pills). ? 6-8 pieces of hard candy. ? 4 oz (120 mL) of fruit juice. ? 4 oz (120 mL) of regular (not diet) soda.  Check your blood sugar 15 minutes after you take the carb.  If your blood sugar is still at or below 70 mg/dL (3.9 mmol/L), take 15 grams of a carb again.  If your blood sugar does not go above 70 mg/dL (3.9 mmol/L) after 3 tries, get help right away.  After your blood sugar goes back to normal, eat a meal or a snack within 1 hour.  Treating Very Low Blood Sugar If your blood sugar is at or below 54 mg/dL (3 mmol/L), you have very low blood sugar (severe hypoglycemia). This is an emergency. Do not wait to see if the symptoms will go away. Get medical help right away. Call your local emergency services (911 in the [place] Do not drive yourself to the  hospital. If you have very low blood sugar and you cannot eat or drink, you may need a glucagon shot (injection). A family member or friend should learn how to check your blood sugar and how to give you a glucagon shot. Ask your doctor if you need to have a glucagon shot kit at home. Follow these instructions at home: General instructions  Avoid any diets that cause you to not eat enough food. Talk with your doctor before you start any new diet.  Take over-the-counter and prescription medicines only as told by your doctor.  Limit alcohol to no more than 1 drink per day for nonpregnant women and 2 drinks per day for men. One drink equals 12 oz of beer, 5 oz of wine, or 1 oz of hard liquor.  Keep all follow-up visits as told by your doctor. This is important. If You Have Diabetes:   Make sure you know the symptoms of low blood sugar.  Always keep a source of sugar with you, such as: ? Sugar. ? Sugar tablets. ? Glucose gel. ? Fruit juice. ? Regular soda (not diet soda). ? Milk. ? Hard candy. ? Honey.  Take your medicines as told.  Follow your exercise and meal plan. ? Eat on time. Do not skip meals. ? Follow your sick day plan when you cannot eat or drink normally. Make this plan ahead of time with your doctor.  Check your blood sugar as often   as told by your doctor. Always check before and after exercise.  Share your diabetes care plan with: ? Your work or school. ? People you live with.  Check your pee (urine) for ketones: ? When you are sick. ? As told by your doctor.  Carry a card or wear jewelry that says you have diabetes. If You Have Low Blood Sugar From Other Causes:   Check your blood sugar as often as told by your doctor.  Follow instructions from your doctor about what you cannot eat or drink. Contact a doctor if:  You have trouble keeping your blood sugar in your target range.  You have low blood sugar often. Get help right away if:  You still have  symptoms after you eat or drink something sugary.  Your blood sugar is at or below 54 mg/dL (3 mmol/L).  You have jerky movements that you cannot control.  You pass out. These symptoms may be an emergency. Do not wait to see if the symptoms will go away. Get medical help right away. Call your local emergency services (911 in the [place] Do not drive yourself to the hospital. This information is not intended to replace advice given to you by your health care provider. Make sure you discuss any questions you have with your health care provider. Document Released: 04/05/2009 Document Revised: 06/17/2015 Document Reviewed: 02/12/2015 Elsevier Interactive Patient Education  [employer].

## 2016-08-11 ENCOUNTER — Telehealth: Payer: Self-pay | Admitting: Nurse Practitioner

## 2016-08-11 LAB — MICROALBUMIN / CREATININE URINE RATIO
Creatinine, Urine: 131.4 mg/dL
Microalb/Creat Ratio: 33.1 mg/g creat — ABNORMAL HIGH (ref 0.0–30.0)
Microalbumin, Urine: 43.5 ug/mL

## 2016-08-15 IMAGING — CR DG CHEST 2V
2 series · 2 of 2 positions shown · non-contrast
Comparison: None.

CLINICAL DATA: Chronic bronchitis.

EXAM:
CHEST  2 VIEW

[view not recorded (1 of 2)]
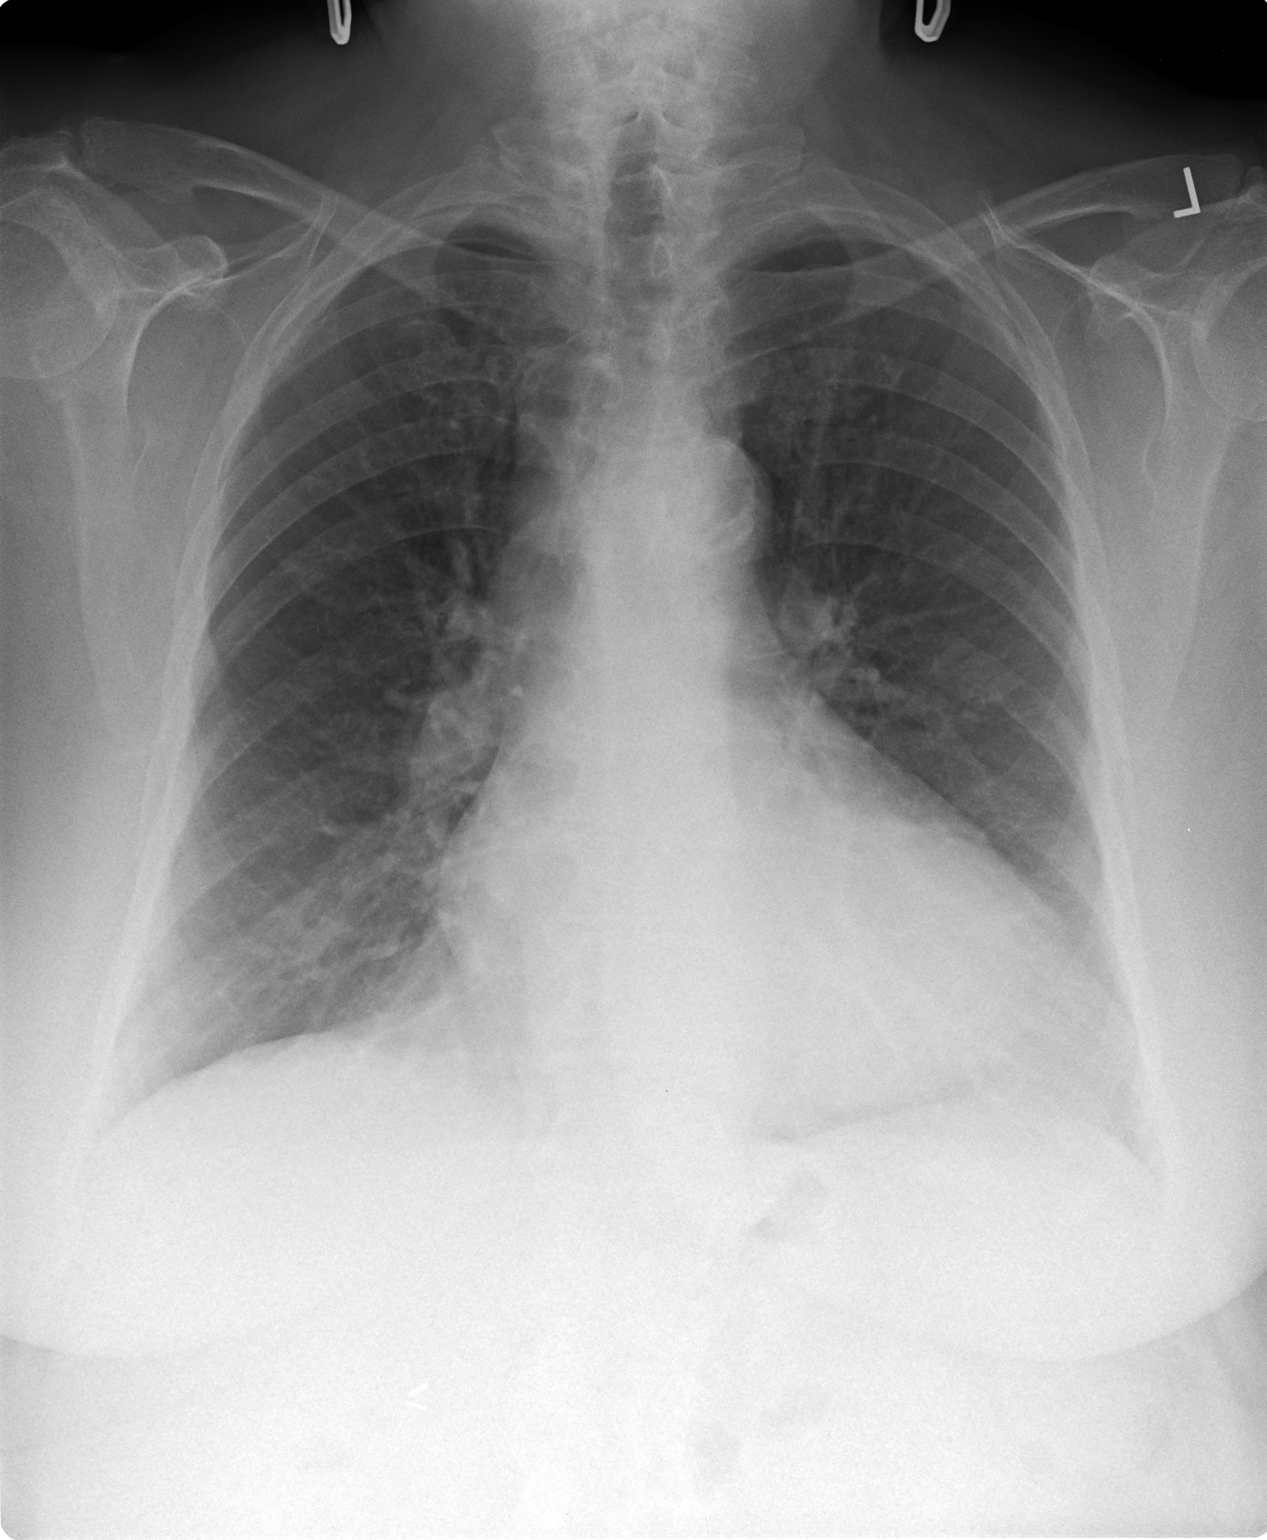

[view not recorded (2 of 2)]
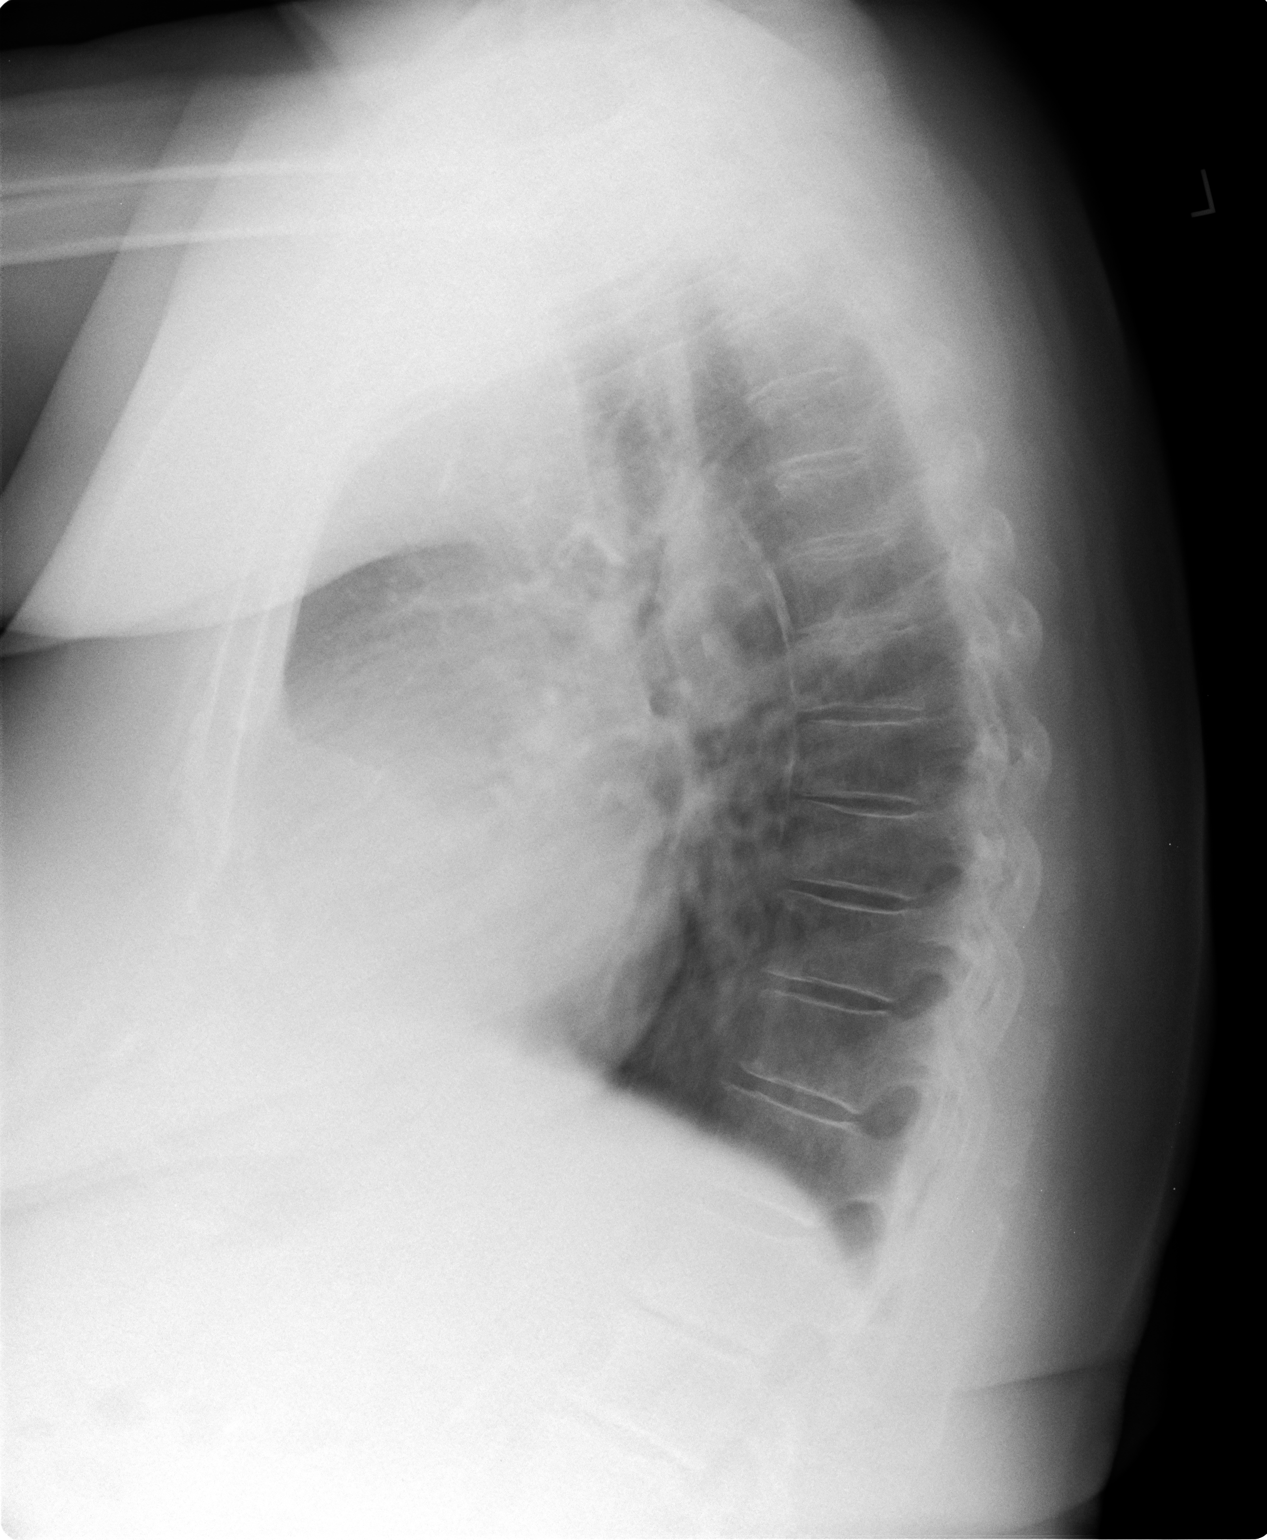

[2 of 2 positions shown; findings below may reference images not displayed]

FINDINGS: Mediastinum and hilar structures are normal. Cardiomegaly is
present. Pulmonary vascularity is normal. Mild right middle lobe
infiltrate. No pleural effusion or pneumothorax. No acute bony
abnormality.
IMPRESSION: 1. Mild right middle lobe infiltrate. Followup chest x-ray suggested
to demonstrate clearing.

2.  Cardiomegaly.  Normal pulmonary vascularity.

## 2016-08-22 ENCOUNTER — Other Ambulatory Visit: Payer: Self-pay

## 2016-08-22 DIAGNOSIS — R69 Illness, unspecified: Secondary | ICD-10-CM | POA: Diagnosis not present

## 2016-08-22 NOTE — Telephone Encounter (Signed)
lmtcb

## 2016-08-23 ENCOUNTER — Other Ambulatory Visit: Payer: Self-pay | Admitting: *Deleted

## 2016-08-23 DIAGNOSIS — R69 Illness, unspecified: Secondary | ICD-10-CM | POA: Diagnosis not present

## 2016-08-23 MED ORDER — GLUCOSE BLOOD VI STRP: ORAL_STRIP | 5 refills | Status: DC

## 2016-08-23 NOTE — Telephone Encounter (Signed)
done

## 2016-09-05 NOTE — Telephone Encounter (Signed)
Patient states BS are better.

## 2016-09-15 ENCOUNTER — Telehealth: Payer: Self-pay | Admitting: Nurse Practitioner

## 2016-09-15 NOTE — Telephone Encounter (Signed)
Left message for pt to call.

## 2016-10-07 NOTE — Telephone Encounter (Signed)
Patient has appointment 10/22

## 2016-10-16 ENCOUNTER — Other Ambulatory Visit: Payer: Self-pay | Admitting: Nurse Practitioner

## 2016-10-16 DIAGNOSIS — F3342 Major depressive disorder, recurrent, in full remission: Secondary | ICD-10-CM

## 2016-10-16 DIAGNOSIS — I10 Essential (primary) hypertension: Secondary | ICD-10-CM

## 2016-10-16 DIAGNOSIS — F410 Panic disorder [episodic paroxysmal anxiety] without agoraphobia: Secondary | ICD-10-CM

## 2016-10-16 DIAGNOSIS — E119 Type 2 diabetes mellitus without complications: Secondary | ICD-10-CM

## 2016-10-16 DIAGNOSIS — E782 Mixed hyperlipidemia: Secondary | ICD-10-CM

## 2016-10-16 DIAGNOSIS — Z794 Long term (current) use of insulin: Principal | ICD-10-CM

## 2016-10-17 NOTE — Telephone Encounter (Signed)
Please call in alprazolam with 1 refills 

## 2016-10-17 NOTE — Telephone Encounter (Signed)
Last seen 08/08/16  MMM  If approved route to nurse to call into [facility]

## 2016-10-18 NOTE — Telephone Encounter (Signed)
Rx called to pharmacy

## 2016-11-09 ENCOUNTER — Ambulatory Visit (INDEPENDENT_AMBULATORY_CARE_PROVIDER_SITE_OTHER): Payer: Medicare HMO | Admitting: Nurse Practitioner

## 2016-11-09 ENCOUNTER — Encounter: Payer: Self-pay | Admitting: Nurse Practitioner

## 2016-11-09 VITALS — BP 141/66 | HR 53 | Temp 97.5°F | Ht 64.0 in | Wt 232.0 lb

## 2016-11-09 DIAGNOSIS — E1142 Type 2 diabetes mellitus with diabetic polyneuropathy: Secondary | ICD-10-CM | POA: Diagnosis not present

## 2016-11-09 DIAGNOSIS — F3342 Major depressive disorder, recurrent, in full remission: Secondary | ICD-10-CM | POA: Diagnosis not present

## 2016-11-09 DIAGNOSIS — R609 Edema, unspecified: Secondary | ICD-10-CM

## 2016-11-09 DIAGNOSIS — Z23 Encounter for immunization: Secondary | ICD-10-CM | POA: Diagnosis not present

## 2016-11-09 DIAGNOSIS — I10 Essential (primary) hypertension: Secondary | ICD-10-CM

## 2016-11-09 DIAGNOSIS — Z794 Long term (current) use of insulin: Secondary | ICD-10-CM

## 2016-11-09 DIAGNOSIS — E782 Mixed hyperlipidemia: Secondary | ICD-10-CM | POA: Diagnosis not present

## 2016-11-09 DIAGNOSIS — F411 Generalized anxiety disorder: Secondary | ICD-10-CM

## 2016-11-09 DIAGNOSIS — E119 Type 2 diabetes mellitus without complications: Secondary | ICD-10-CM

## 2016-11-09 DIAGNOSIS — R69 Illness, unspecified: Secondary | ICD-10-CM | POA: Diagnosis not present

## 2016-11-09 LAB — CMP14+EGFR
ALBUMIN: 4 g/dL (ref 3.5–4.7)
ALT: 13 IU/L (ref 0–32)
AST: 19 IU/L (ref 0–40)
Albumin/Globulin Ratio: 1.5 (ref 1.2–2.2)
Alkaline Phosphatase: 66 IU/L (ref 39–117)
BUN/Creatinine Ratio: 13 (ref 12–28)
BUN: 13 mg/dL (ref 8–27)
Bilirubin Total: 0.4 mg/dL (ref 0.0–1.2)
CALCIUM: 8.8 mg/dL (ref 8.7–10.3)
CO2: 25 mmol/L (ref 20–29)
CREATININE: 1.01 mg/dL — AB (ref 0.57–1.00)
Chloride: 102 mmol/L (ref 96–106)
GFR calc Af Amer: 59 mL/min/{1.73_m2} — ABNORMAL LOW (ref >59)
GFR, EST NON AFRICAN AMERICAN: 51 mL/min/{1.73_m2} — AB (ref >59)
GLOBULIN, TOTAL: 2.6 g/dL (ref 1.5–4.5)
GLUCOSE: 109 mg/dL — AB (ref 65–99)
Potassium: 5 mmol/L (ref 3.5–5.2)
SODIUM: 138 mmol/L (ref 134–144)
Total Protein: 6.6 g/dL (ref 6.0–8.5)

## 2016-11-09 LAB — LIPID PANEL
CHOL/HDL RATIO: 2.3 ratio (ref 0.0–4.4)
CHOLESTEROL TOTAL: 101 mg/dL (ref 100–199)
HDL: 43 mg/dL (ref >39)
LDL CALC: 41 mg/dL (ref 0–99)
TRIGLYCERIDES: 86 mg/dL (ref 0–149)
VLDL CHOLESTEROL CAL: 17 mg/dL (ref 5–40)

## 2016-11-09 LAB — BAYER DCA HB A1C WAIVED: HB A1C (BAYER DCA - WAIVED): 6.4 % (ref <7.0)

## 2016-11-09 MED ORDER — ATORVASTATIN CALCIUM 40 MG PO TABS: 40.0000 mg | ORAL_TABLET | Freq: Every day | ORAL | 1 refills | Status: DC

## 2016-11-09 MED ORDER — METOPROLOL TARTRATE 50 MG PO TABS: 50.0000 mg | ORAL_TABLET | Freq: Two times a day (BID) | ORAL | 1 refills | Status: DC

## 2016-11-09 MED ORDER — CITALOPRAM HYDROBROMIDE 40 MG PO TABS: 40.0000 mg | ORAL_TABLET | Freq: Every day | ORAL | 1 refills | Status: DC

## 2016-11-09 MED ORDER — AMLODIPINE BESY-BENAZEPRIL HCL 2.5-10 MG PO CAPS: 1.0000 | ORAL_CAPSULE | Freq: Every day | ORAL | 1 refills | Status: DC

## 2016-11-09 NOTE — Progress Notes (Signed)
Subjective:    Patient ID: Kimberly Mcdowell, female    DOB: 1930-05-14, 83 y.o.   MRN: [id number]  HPI   Kimberly Mcdowell is here today for follow up of chronic medical problem.  Outpatient Encounter Prescriptions as of 11/09/2016  Medication Sig  . ALPRAZolam (XANAX) 0.25 MG tablet Take 1 Tablet by mouth at bedtime  . amlodipine-benazepril (LOTREL) 2.5-10 MG capsule Take 1 capsule by mouth daily.  [name] amlodipine-benazepril (LOTREL) 2.5-10 MG capsule Take 1 Capsule by mouth once daily  . atorvastatin (LIPITOR) 40 MG tablet Take 1 tablet (40 mg total) by mouth daily.  [name] atorvastatin (LIPITOR) 40 MG tablet Take 1 Tablet by mouth once daily  . Cholecalciferol (VITAMIN D3) 2000 UNITS TABS Take 2,000 Int'l Units by mouth daily.    . citalopram (CELEXA) 40 MG tablet Take 1 tablet (40 mg total) by mouth daily.  . citalopram (CELEXA) 40 MG tablet Take 1 Tablet by mouth once daily  . furosemide (LASIX) 40 MG tablet Take 1 tablet (40 mg total) by mouth daily.  [name] gabapentin (NEURONTIN) 100 MG capsule Take 1 capsule (100 mg total) by mouth 3 (three) times daily.  [name] glipiZIDE (GLUCOTROL XL) 2.5 MG 24 hr tablet Take 1 Tablet by mouth once daily  . glipiZIDE (GLUCOTROL) 5 MG tablet Take 1 tablet (5 mg total) by mouth 2 (two) times daily before a meal.  . glucose blood (ACCU-CHEK SMARTVIEW) test strip Test blood glucose up to three times a day  . meclizine (ANTIVERT) 25 MG tablet Take 1 tablet (25 mg total) by mouth 3 (three) times daily as needed for dizziness.  . metFORMIN (GLUCOPHAGE) 1000 MG tablet Take 1 Tablet by mouth 2 times a day with a meal  . metoprolol tartrate (LOPRESSOR) 50 MG tablet Take 1 tablet (50 mg total) by mouth 2 (two) times daily.    1. Essential hypertension  No c/o chest pain, sob or headache. Does not check blood pressures at home BP Readings from Last 3 Encounters:  08/08/16 (!) 172/91  02/24/16 (!) 146/84  11/26/15 136/71     2. Type 2 diabetes mellitus without  complication, with long-term current use of insulin ([place])  last hgba1c was 9.1%. We increased glipizide to 86m daily  3. Mixed hyperlipidemia  Does not watch diet  4. Diabetic polyneuropathy associated with type 2 diabetes mellitus ([place]  feet burn all the time- never goes barefooted- has no sores on feet.  5. Peripheral edema  sweeling never completely goes down  6. Morbid obesity ([place]   no weight changes  7. GAD (generalized anxiety disorder)  Takes xanax 0.25 only at night or in evening  8. Recurrent major depressive disorder, in full remission ([facility]  Patient is on celexa- is doing well [facility] 11/09/2016 08/08/2016 02/24/2016 11/26/2015 09/07/2015  Decreased Interest 0 0 0 0 0  Down, Depressed, Hopeless 0 0 0 0 0  PHQ - 2 Score 0 0 0 0 0  Altered sleeping - - - - -  Tired, decreased energy - - - - -  Change in appetite - - - - -  Feeling bad or failure about yourself  - - - - -  Trouble concentrating - - - - -  Moving slowly or fidgety/restless - - - - -  Suicidal thoughts - - - - -  PHQ-9 Score - - - - -  Difficult doing work/chores - - - - -  New complaints: Non etoday  Social history: Lives alone   Review of Systems  Constitutional: Negative for activity change and appetite change.  HENT: Negative.   Eyes: Negative for pain.  Respiratory: Negative for shortness of breath.   Cardiovascular: Negative for chest pain, palpitations and leg swelling.  Gastrointestinal: Negative for abdominal pain.  Endocrine: Negative for polydipsia.  Genitourinary: Negative.   Skin: Negative for rash.  Neurological: Negative for dizziness, weakness and headaches.  Hematological: Does not bruise/bleed easily.  Psychiatric/Behavioral: Negative.   All other systems reviewed and are negative.      Objective:   Physical Exam  Constitutional: She is oriented to person, place, and time. She appears well-developed and well-nourished.  HENT:  Nose: Nose normal.    Mouth/Throat: Oropharynx is clear and moist.  Eyes: EOM are normal.  Neck: Trachea normal, normal range of motion and full passive range of motion without pain. Neck supple. No JVD present. Carotid bruit is not present. No thyromegaly present.  Cardiovascular: Normal rate, regular rhythm, normal heart sounds and intact distal pulses.  Exam reveals no gallop and no friction rub.   No murmur heard. Pulmonary/Chest: Effort normal and breath sounds normal.  Abdominal: Soft. Bowel sounds are normal. She exhibits no distension and no mass. There is no tenderness.  Musculoskeletal: Normal range of motion.  Lymphadenopathy:    She has no cervical adenopathy.  Neurological: She is alert and oriented to person, place, and time. She has normal reflexes.  Skin: Skin is warm and dry.  Psychiatric: She has a normal mood and affect. Her behavior is normal. Judgment and thought content normal.   BP (!) 141/66   Pulse (!) 53   Temp (!) 97.5 F (36.4 C) (Oral)   Ht '5\' 4"'  (1.626 m)   Wt 232 lb (105.2 kg)   BMI 39.82 kg/m   hgba1c 6.4%    Assessment & Plan:  1. Essential hypertension Low sodium diet - CMP14+EGFR - metoprolol tartrate (LOPRESSOR) 50 MG tablet; Take 1 tablet (50 mg total) by mouth 2 (two) times daily.  Dispense: 180 tablet; Refill: 1 - amlodipine-benazepril (LOTREL) 2.5-10 MG capsule; Take 1 capsule by mouth daily.  Dispense: 90 capsule; Refill: 1  2. Type 2 diabetes mellitus without complication, with long-term current use of insulin ([name]) Continue to watch carbs in diet - Bayer DCA Hb A1c Waived  3. Mixed hyperlipidemia Low fat diet - Lipid panel - atorvastatin (LIPITOR) 40 MG tablet; Take 1 tablet (40 mg total) by mouth daily.  Dispense: 90 tablet; Refill: 1  4. Diabetic polyneuropathy associated with type 2 diabetes mellitus ([place]) Do not go barefooted Check feet daily for cuts or lesions  5. Peripheral edema Elevate legs when sitting  6. Morbid obesity  ([place]) Discussed diet and exercise for person with BMI >25 Will recheck weight in 3-6 months  7. GAD (generalized anxiety disorder) Stress management  8. Recurrent major depressive disorder, in full remission ([place]) - citalopram (CELEXA) 40 MG tablet; Take 1 tablet (40 mg total) by mouth daily.  Dispense: 90 tablet; Refill: 1    Labs pending Health maintenance reviewed Diet and exercise encouraged Continue all meds Follow up  In 3 months   [place], FNP

## 2016-11-09 NOTE — Patient Instructions (Signed)

## 2016-11-13 ENCOUNTER — Ambulatory Visit: Payer: Medicare HMO | Admitting: Nurse Practitioner

## 2016-11-25 ENCOUNTER — Other Ambulatory Visit: Payer: Self-pay | Admitting: Nurse Practitioner

## 2016-11-25 DIAGNOSIS — F410 Panic disorder [episodic paroxysmal anxiety] without agoraphobia: Secondary | ICD-10-CM

## 2016-11-27 NOTE — Telephone Encounter (Signed)
Please call in xanax with 1 refills 

## 2016-11-27 NOTE — Telephone Encounter (Signed)
rx called into pharmacy

## 2016-12-16 ENCOUNTER — Other Ambulatory Visit: Payer: Self-pay | Admitting: Nurse Practitioner

## 2016-12-16 DIAGNOSIS — E1142 Type 2 diabetes mellitus with diabetic polyneuropathy: Secondary | ICD-10-CM

## 2016-12-29 ENCOUNTER — Telehealth: Payer: Self-pay

## 2016-12-29 NOTE — Telephone Encounter (Signed)
One time a day

## 2016-12-29 NOTE — Telephone Encounter (Signed)
Glipizide ER 2.5 mg, does patient take this once daily or twice daily?

## 2016-12-30 NOTE — Telephone Encounter (Signed)
Pharmacy aware

## 2017-02-13 ENCOUNTER — Ambulatory Visit: Payer: Medicare HMO | Admitting: Nurse Practitioner

## 2017-02-16 ENCOUNTER — Other Ambulatory Visit: Payer: Self-pay | Admitting: *Deleted

## 2017-02-16 DIAGNOSIS — F410 Panic disorder [episodic paroxysmal anxiety] without agoraphobia: Secondary | ICD-10-CM

## 2017-02-16 MED ORDER — ALPRAZOLAM 0.25 MG PO TABS: 0.2500 mg | ORAL_TABLET | Freq: Every day | ORAL | 1 refills | Status: DC

## 2017-02-16 NOTE — Addendum Note (Signed)
Addended by: Bennie PieriniMARTIN, MARY-MARGARET on: 02/16/2017 01:54 PM   Modules accepted: Orders

## 2017-03-09 ENCOUNTER — Other Ambulatory Visit: Payer: Self-pay | Admitting: Nurse Practitioner

## 2017-03-27 ENCOUNTER — Ambulatory Visit (INDEPENDENT_AMBULATORY_CARE_PROVIDER_SITE_OTHER): Payer: Medicare HMO | Admitting: Nurse Practitioner

## 2017-03-27 ENCOUNTER — Encounter: Payer: Self-pay | Admitting: Nurse Practitioner

## 2017-03-27 ENCOUNTER — Telehealth: Payer: Self-pay | Admitting: Nurse Practitioner

## 2017-03-27 VITALS — BP 128/57 | HR 60 | Temp 98.3°F | Ht 64.0 in | Wt 229.4 lb

## 2017-03-27 DIAGNOSIS — Z794 Long term (current) use of insulin: Secondary | ICD-10-CM

## 2017-03-27 DIAGNOSIS — E1142 Type 2 diabetes mellitus with diabetic polyneuropathy: Secondary | ICD-10-CM

## 2017-03-27 DIAGNOSIS — E782 Mixed hyperlipidemia: Secondary | ICD-10-CM | POA: Diagnosis not present

## 2017-03-27 DIAGNOSIS — E119 Type 2 diabetes mellitus without complications: Secondary | ICD-10-CM

## 2017-03-27 DIAGNOSIS — I10 Essential (primary) hypertension: Secondary | ICD-10-CM | POA: Diagnosis not present

## 2017-03-27 DIAGNOSIS — R609 Edema, unspecified: Secondary | ICD-10-CM

## 2017-03-27 DIAGNOSIS — F411 Generalized anxiety disorder: Secondary | ICD-10-CM | POA: Diagnosis not present

## 2017-03-27 DIAGNOSIS — R441 Visual hallucinations: Secondary | ICD-10-CM | POA: Diagnosis not present

## 2017-03-27 DIAGNOSIS — F3342 Major depressive disorder, recurrent, in full remission: Secondary | ICD-10-CM | POA: Diagnosis not present

## 2017-03-27 DIAGNOSIS — F410 Panic disorder [episodic paroxysmal anxiety] without agoraphobia: Secondary | ICD-10-CM

## 2017-03-27 DIAGNOSIS — R600 Localized edema: Secondary | ICD-10-CM

## 2017-03-27 DIAGNOSIS — R69 Illness, unspecified: Secondary | ICD-10-CM | POA: Diagnosis not present

## 2017-03-27 LAB — BAYER DCA HB A1C WAIVED: HB A1C: 6.9 % (ref <7.0)

## 2017-03-27 MED ORDER — METFORMIN HCL 1000 MG PO TABS: ORAL_TABLET | ORAL | 1 refills | Status: DC

## 2017-03-27 MED ORDER — GLIPIZIDE ER 2.5 MG PO TB24: 2.5000 mg | ORAL_TABLET | Freq: Every day | ORAL | 1 refills | Status: DC

## 2017-03-27 MED ORDER — CITALOPRAM HYDROBROMIDE 40 MG PO TABS: 40.0000 mg | ORAL_TABLET | Freq: Every day | ORAL | 1 refills | Status: DC

## 2017-03-27 MED ORDER — GABAPENTIN 100 MG PO CAPS: 100.0000 mg | ORAL_CAPSULE | Freq: Three times a day (TID) | ORAL | 1 refills | Status: DC

## 2017-03-27 MED ORDER — ATORVASTATIN CALCIUM 40 MG PO TABS: 40.0000 mg | ORAL_TABLET | Freq: Every day | ORAL | 1 refills | Status: DC

## 2017-03-27 MED ORDER — ALPRAZOLAM 0.25 MG PO TABS: 0.2500 mg | ORAL_TABLET | Freq: Every day | ORAL | 1 refills | Status: DC

## 2017-03-27 MED ORDER — METOPROLOL TARTRATE 50 MG PO TABS: 50.0000 mg | ORAL_TABLET | Freq: Two times a day (BID) | ORAL | 1 refills | Status: DC

## 2017-03-27 MED ORDER — AMLODIPINE BESY-BENAZEPRIL HCL 2.5-10 MG PO CAPS: 1.0000 | ORAL_CAPSULE | Freq: Every day | ORAL | 1 refills | Status: DC

## 2017-03-27 NOTE — Progress Notes (Signed)
Subjective:    Patient ID: Kimberly Mcdowell, female    DOB: [birth year], 84 y.o.   MRN: [id number]  HPI  Kimberly Mcdowell is here today for follow up of chronic medical problem.  Outpatient Encounter Medications as of 03/27/2017  Medication Sig  . ALPRAZolam (XANAX) 0.25 MG tablet Take 1 tablet (0.25 mg total) by mouth at bedtime.  Kimberly Mcdowell amlodipine-benazepril (LOTREL) 2.5-10 MG capsule Take 1 capsule by mouth daily.  Kimberly Mcdowell atorvastatin (LIPITOR) 40 MG tablet Take 1 tablet (40 mg total) by mouth daily.  . Cholecalciferol (VITAMIN D3) 2000 UNITS TABS Take 2,000 Int'l Units by mouth daily.    . citalopram (CELEXA) 40 MG tablet Take 1 tablet (40 mg total) by mouth daily.  . furosemide (LASIX) 40 MG tablet Take 1 tablet (40 mg total) by mouth daily.  Kimberly Mcdowell gabapentin (NEURONTIN) 100 MG capsule TAKE 1 CAPSULE BY MOUTH 3 TIMES DAILY  . glipiZIDE (GLUCOTROL XL) 2.5 MG 24 hr tablet Take 1 Tablet by mouth once daily (Patient taking differently: Takes 2 daily)  . glipiZIDE (GLUCOTROL XL) 2.5 MG 24 hr tablet TAKE 1 TABLET DAILY  . glucose blood (ACCU-CHEK SMARTVIEW) test strip Test blood glucose up to three times a day  . meclizine (ANTIVERT) 25 MG tablet Take 1 tablet (25 mg total) by mouth 3 (three) times daily as needed for dizziness.  . metFORMIN (GLUCOPHAGE) 1000 MG tablet Take 1 Tablet by mouth 2 times a day with a meal  . metoprolol tartrate (LOPRESSOR) 50 MG tablet Take 1 tablet (50 mg total) by mouth 2 (two) times daily.     1. Essential hypertension  No c/o chest pain, sob or headache. Does not check blood pressure at home. BP Readings from Last 3 Encounters:  11/09/16 (!) 141/66  08/08/16 (!) 172/91  02/24/16 (!) 146/84     2. Type 2 diabetes mellitus without complication, with long-term current use of insulin ([name]) Last hgba1c was  6.4%. She does not check her blood sugars very often because she hate sto prick her finger.  3. Diabetic polyneuropathy associated with type 2 diabetes mellitus  ([name]) Has constant numbness in bil feet   4. Recurrent major depressive disorder, in full remission ([place])  Has been on celexa for several years and says she is doing well. Her stress level is the only thing that seems to affect her mood.  5. GAD (generalized anxiety disorder)  she has been on xanax for many years and takes only 1x a day  6. Mixed hyperlipidemia  Does not really watch diet very closely  7. Morbid obesity ([place])  No recent weight changes  8. Peripheral edema  Has lower ext edema if she ois up on her feet alot- will usually resolve during night. She has not been taking her lasix lately.    New complaints: Patient says she is hallucinating all the time. She said that she wakes up at night and sees people in her house. She said she says chinese people in her house.she says it does not scare her but has become very nerve racking. Usually only happens at night when she is coming out of a sleep. It goes away on its own. She is on no new meds Patient has a niece that claims to speak to dead people and worships the devil. Se has told her niece that she is no longer welcome at her house and every since then she has been seeing these people at night.  Social history: She  only drives to doctor office and to grocery store now. She has some friends that she sees several times a week that she does things with. She lives alone and her sons call and visit several times a week.    Review of Systems  Constitutional: Positive for fatigue. Negative for activity change and appetite change.  HENT: Negative.   Eyes: Negative for pain.  Respiratory: Negative for shortness of breath.   Cardiovascular: Positive for leg swelling. Negative for chest pain and palpitations.  Gastrointestinal: Negative for abdominal pain.  Endocrine: Negative for polydipsia.  Genitourinary: Negative.   Musculoskeletal: Negative for gait problem.  Skin: Negative for rash.  Neurological: Negative for dizziness,  weakness and headaches.  Hematological: Does not bruise/bleed easily.  Psychiatric/Behavioral: Negative.   All other systems reviewed and are negative.      Objective:   Physical Exam  Constitutional: She is oriented to person, place, and time. She appears well-developed and well-nourished.  HENT:  Nose: Nose normal.  Mouth/Throat: Oropharynx is clear and moist.  Eyes: EOM are normal.  Neck: Trachea normal, normal range of motion and full passive range of motion without pain. Neck supple. No JVD present. Carotid bruit is not present. No thyromegaly present.  Cardiovascular: Normal rate, regular rhythm, normal heart sounds and intact distal pulses. Exam reveals no gallop and no friction rub.  No murmur heard. Pulmonary/Chest: Effort normal and breath sounds normal.  Abdominal: Soft. Bowel sounds are normal. She exhibits no distension and no mass. There is no tenderness.  Musculoskeletal: Normal range of motion. She exhibits edema (1+ edema bil lower ext).  Lymphadenopathy:    She has no cervical adenopathy.  Neurological: She is alert and oriented to person, place, and time. She has normal reflexes.  Skin: Skin is warm and dry.  Psychiatric: She has a normal mood and affect. Her behavior is normal. Judgment and thought content normal.   BP (!) 128/57   Pulse 60   Temp 98.3 F (36.8 C) (Oral)   Ht _0  (1.626 m)   Wt 229 lb 6.4 oz (104.1 kg)   BMI 39.38 kg/m   HGBA1c 6.9%     Assessment & Plan:  1. Essential hypertension Low sodium diet - CMP14+EGFR - metoprolol tartrate (LOPRESSOR) 50 MG tablet; Take 1 tablet (50 mg total) by mouth 2 (two) times daily.  Dispense: 180 tablet; Refill: 1 - amlodipine-benazepril (LOTREL) 2.5-10 MG capsule; Take 1 capsule by mouth daily.  Dispense: 90 capsule; Refill: 1  2. Type 2 diabetes mellitus without complication, with long-term current use of insulin ([name]) Continue to watch carbsin diet - Bayer DCA Hb A1c Waived - metFORMIN  (GLUCOPHAGE) 1000 MG tablet; Take 1 Tablet by mouth 2 times a day with a meal  Dispense: 180 tablet; Refill: 1  3. Diabetic polyneuropathy associated with type 2 diabetes mellitus ([place]) Do not go barefooted Check feet daily - gabapentin (NEURONTIN) 100 MG capsule; Take 1 capsule (100 mg total) by mouth 3 (three) times daily.  Dispense: 270 capsule; Refill: 1  4. Recurrent major depressive disorder, in full remission ([place]) Stress management - citalopram (CELEXA) 40 MG tablet; Take 1 tablet (40 mg total) by mouth daily.  Dispense: 90 tablet; Refill: 1  5. GAD (generalized anxiety disorder) Continue xanax as neded  6. Mixed hyperlipidemia Low fat diet - Lipid panel - atorvastatin (LIPITOR) 40 MG tablet; Take 1 tablet (40 mg total) by mouth daily.  Dispense: 90 tablet; Refill: 1  7. Morbid obesity ([place]) Discussed  diet and exercise for person with BMI >25 Will recheck weight in 3-6 months  8. Peripheral edema Elevate leg when sitting  9. Hallucinations, visual Keep diary of events    Labs pending Health maintenance reviewed Diet and exercise encouraged Continue all meds Follow up  In 3 months   [place], FNP

## 2017-03-27 NOTE — Telephone Encounter (Signed)
Form has been given to provider for review.

## 2017-03-27 NOTE — Telephone Encounter (Signed)
Pt notified form is ready for pick up Form to front

## 2017-03-27 NOTE — Patient Instructions (Signed)

## 2017-03-28 ENCOUNTER — Telehealth: Payer: Self-pay | Admitting: *Deleted

## 2017-03-28 LAB — CMP14+EGFR
A/G RATIO: 1.4 (ref 1.2–2.2)
ALBUMIN: 4.2 g/dL (ref 3.5–4.7)
ALT: 17 IU/L (ref 0–32)
AST: 14 IU/L (ref 0–40)
Alkaline Phosphatase: 79 IU/L (ref 39–117)
BILIRUBIN TOTAL: 0.3 mg/dL (ref 0.0–1.2)
BUN / CREAT RATIO: 19 (ref 12–28)
BUN: 20 mg/dL (ref 8–27)
CHLORIDE: 104 mmol/L (ref 96–106)
CO2: 25 mmol/L (ref 20–29)
Calcium: 10.1 mg/dL (ref 8.7–10.3)
Creatinine, Ser: 1.06 mg/dL — ABNORMAL HIGH (ref 0.57–1.00)
GFR calc Af Amer: 56 mL/min/{1.73_m2} — ABNORMAL LOW (ref >59)
GFR calc non Af Amer: 48 mL/min/{1.73_m2} — ABNORMAL LOW (ref >59)
Globulin, Total: 3.1 g/dL (ref 1.5–4.5)
Glucose: 139 mg/dL — ABNORMAL HIGH (ref 65–99)
POTASSIUM: 7.1 mmol/L — AB (ref 3.5–5.2)
Sodium: 147 mmol/L — ABNORMAL HIGH (ref 134–144)
TOTAL PROTEIN: 7.3 g/dL (ref 6.0–8.5)

## 2017-03-28 LAB — LIPID PANEL
Chol/HDL Ratio: 3.3 ratio (ref 0.0–4.4)
Cholesterol, Total: 156 mg/dL (ref 100–199)
HDL: 47 mg/dL (ref >39)
LDL Calculated: 83 mg/dL (ref 0–99)
Triglycerides: 129 mg/dL (ref 0–149)
VLDL Cholesterol Cal: 26 mg/dL (ref 5–40)

## 2017-03-28 NOTE — Telephone Encounter (Signed)
Attempted to contact patient regarding critical lab value.  Patient has a potassium level of 7.1.  Patient needs to come in today 03/06 to have labs redrawn and have an EKG per Dr. Oswaldo DoneVincent.  Multiple attempts made to contact patient.  Will continue to try to contact her.

## 2017-03-31 ENCOUNTER — Other Ambulatory Visit: Payer: Medicare HMO

## 2017-03-31 ENCOUNTER — Other Ambulatory Visit: Payer: Self-pay

## 2017-03-31 ENCOUNTER — Telehealth: Payer: Self-pay | Admitting: Nurse Practitioner

## 2017-03-31 DIAGNOSIS — E875 Hyperkalemia: Secondary | ICD-10-CM

## 2017-03-31 MED ORDER — SODIUM POLYSTYRENE SULFONATE PO POWD: ORAL | 0 refills | Status: DC

## 2017-03-31 NOTE — Telephone Encounter (Signed)
Patient potassium was 7.1 on Tuesday. We  Have ben trying to reach oatent since then to come in and repeat. We have left severalk messages. ireached her today and told her [facility] and we need to repeat potassium as soon as possible. Patient understood and would see if she can get someone to bring her. She denies nay chest pain or sob.

## 2017-04-01 LAB — BMP8+EGFR
BUN/Creatinine Ratio: 16 (ref 12–28)
BUN: 19 mg/dL (ref 8–27)
CALCIUM: 9 mg/dL (ref 8.7–10.3)
CHLORIDE: 105 mmol/L (ref 96–106)
CO2: 25 mmol/L (ref 20–29)
Creatinine, Ser: 1.17 mg/dL — ABNORMAL HIGH (ref 0.57–1.00)
GFR calc Af Amer: 49 mL/min/{1.73_m2} — ABNORMAL LOW (ref >59)
GFR calc non Af Amer: 43 mL/min/{1.73_m2} — ABNORMAL LOW (ref >59)
Glucose: 139 mg/dL — ABNORMAL HIGH (ref 65–99)
Potassium: 5.9 mmol/L (ref 3.5–5.2)
Sodium: 143 mmol/L (ref 134–144)

## 2017-04-03 ENCOUNTER — Telehealth: Payer: Self-pay | Admitting: Nurse Practitioner

## 2017-04-03 MED ORDER — AMLODIPINE BESYLATE 5 MG PO TABS: 5.0000 mg | ORAL_TABLET | Freq: Every day | ORAL | 3 refills | Status: DC

## 2017-04-03 NOTE — Telephone Encounter (Signed)
Lets stop lotrel and increase amlodipine to 5mg  and see if wil help with potassium level. coem in a week after change and let nurse check your blood pressure.

## 2017-06-19 DIAGNOSIS — J42 Unspecified chronic bronchitis: Secondary | ICD-10-CM | POA: Diagnosis not present

## 2017-06-19 DIAGNOSIS — G8929 Other chronic pain: Secondary | ICD-10-CM | POA: Diagnosis not present

## 2017-06-19 DIAGNOSIS — E785 Hyperlipidemia, unspecified: Secondary | ICD-10-CM | POA: Diagnosis not present

## 2017-06-19 DIAGNOSIS — R32 Unspecified urinary incontinence: Secondary | ICD-10-CM | POA: Diagnosis not present

## 2017-06-19 DIAGNOSIS — E1142 Type 2 diabetes mellitus with diabetic polyneuropathy: Secondary | ICD-10-CM | POA: Diagnosis not present

## 2017-06-19 DIAGNOSIS — R69 Illness, unspecified: Secondary | ICD-10-CM | POA: Diagnosis not present

## 2017-06-19 DIAGNOSIS — M1990 Unspecified osteoarthritis, unspecified site: Secondary | ICD-10-CM | POA: Diagnosis not present

## 2017-06-19 DIAGNOSIS — I10 Essential (primary) hypertension: Secondary | ICD-10-CM | POA: Diagnosis not present

## 2017-07-05 ENCOUNTER — Ambulatory Visit: Payer: Medicare HMO | Admitting: Nurse Practitioner

## 2017-07-23 DIAGNOSIS — Z961 Presence of intraocular lens: Secondary | ICD-10-CM | POA: Diagnosis not present

## 2017-07-23 DIAGNOSIS — H40032 Anatomical narrow angle, left eye: Secondary | ICD-10-CM | POA: Diagnosis not present

## 2017-07-23 DIAGNOSIS — H40013 Open angle with borderline findings, low risk, bilateral: Secondary | ICD-10-CM | POA: Diagnosis not present

## 2017-07-24 ENCOUNTER — Ambulatory Visit: Payer: Medicare HMO | Admitting: Nurse Practitioner

## 2017-08-06 DIAGNOSIS — H402223 Chronic angle-closure glaucoma, left eye, severe stage: Secondary | ICD-10-CM | POA: Diagnosis not present

## 2017-08-06 DIAGNOSIS — H35341 Macular cyst, hole, or pseudohole, right eye: Secondary | ICD-10-CM | POA: Diagnosis not present

## 2017-09-03 DIAGNOSIS — H35341 Macular cyst, hole, or pseudohole, right eye: Secondary | ICD-10-CM | POA: Diagnosis not present

## 2017-09-03 DIAGNOSIS — H402223 Chronic angle-closure glaucoma, left eye, severe stage: Secondary | ICD-10-CM | POA: Diagnosis not present

## 2017-09-05 ENCOUNTER — Ambulatory Visit (INDEPENDENT_AMBULATORY_CARE_PROVIDER_SITE_OTHER): Payer: Medicare HMO | Admitting: Nurse Practitioner

## 2017-09-05 ENCOUNTER — Encounter: Payer: Self-pay | Admitting: Nurse Practitioner

## 2017-09-05 VITALS — BP 141/75 | HR 54 | Temp 97.8°F | Ht 64.0 in | Wt 227.0 lb

## 2017-09-05 DIAGNOSIS — E1142 Type 2 diabetes mellitus with diabetic polyneuropathy: Secondary | ICD-10-CM | POA: Diagnosis not present

## 2017-09-05 DIAGNOSIS — F3342 Major depressive disorder, recurrent, in full remission: Secondary | ICD-10-CM

## 2017-09-05 DIAGNOSIS — I10 Essential (primary) hypertension: Secondary | ICD-10-CM

## 2017-09-05 DIAGNOSIS — E782 Mixed hyperlipidemia: Secondary | ICD-10-CM

## 2017-09-05 DIAGNOSIS — F411 Generalized anxiety disorder: Secondary | ICD-10-CM

## 2017-09-05 DIAGNOSIS — R69 Illness, unspecified: Secondary | ICD-10-CM | POA: Diagnosis not present

## 2017-09-05 DIAGNOSIS — E119 Type 2 diabetes mellitus without complications: Secondary | ICD-10-CM

## 2017-09-05 DIAGNOSIS — F410 Panic disorder [episodic paroxysmal anxiety] without agoraphobia: Secondary | ICD-10-CM

## 2017-09-05 DIAGNOSIS — Z794 Long term (current) use of insulin: Secondary | ICD-10-CM

## 2017-09-05 DIAGNOSIS — R609 Edema, unspecified: Secondary | ICD-10-CM

## 2017-09-05 LAB — BAYER DCA HB A1C WAIVED: HB A1C: 7 % — AB (ref <7.0)

## 2017-09-05 MED ORDER — METFORMIN HCL 1000 MG PO TABS: ORAL_TABLET | ORAL | 1 refills | Status: DC

## 2017-09-05 MED ORDER — METOPROLOL TARTRATE 50 MG PO TABS: 50.0000 mg | ORAL_TABLET | Freq: Two times a day (BID) | ORAL | 1 refills | Status: DC

## 2017-09-05 MED ORDER — ATORVASTATIN CALCIUM 40 MG PO TABS: 40.0000 mg | ORAL_TABLET | Freq: Every day | ORAL | 1 refills | Status: DC

## 2017-09-05 MED ORDER — CITALOPRAM HYDROBROMIDE 40 MG PO TABS: 40.0000 mg | ORAL_TABLET | Freq: Every day | ORAL | 1 refills | Status: DC

## 2017-09-05 MED ORDER — GABAPENTIN 100 MG PO CAPS: 100.0000 mg | ORAL_CAPSULE | Freq: Three times a day (TID) | ORAL | 1 refills | Status: DC

## 2017-09-05 MED ORDER — AMLODIPINE BESYLATE 5 MG PO TABS: 5.0000 mg | ORAL_TABLET | Freq: Every day | ORAL | 3 refills | Status: DC

## 2017-09-05 MED ORDER — FUROSEMIDE 40 MG PO TABS: 40.0000 mg | ORAL_TABLET | Freq: Every day | ORAL | 1 refills | Status: DC

## 2017-09-05 MED ORDER — ALPRAZOLAM 0.25 MG PO TABS: 0.2500 mg | ORAL_TABLET | Freq: Every day | ORAL | 1 refills | Status: DC

## 2017-09-05 MED ORDER — GLIPIZIDE ER 2.5 MG PO TB24: 2.5000 mg | ORAL_TABLET | Freq: Every day | ORAL | 1 refills | Status: DC

## 2017-09-05 NOTE — Progress Notes (Signed)
Subjective:    Patient ID: Kimberly Mcdowell, female    DOB: [birth year], 84 y.o.   MRN: [id number]   Chief Complaint: Medical Management of chronic issues  HPI:  1. Essential hypertension  No c/o chest pain sob or headache. Does not check blood pressure at home. BP Readings from Last 3 Encounters:  03/27/17 (!) 128/57  11/09/16 (!) 141/66  08/08/16 (!) 172/91     2. Type 2 diabetes mellitus without complication, with long-term current use of insulin ([name]) Last hgab1c was  6.9%. She does not check blood sugars everyday at home. denies any hypoglycemia. Her change on vision makes it difficult to check her blood sugars frequently  3. Mixed hyperlipidemia  Is on lipitor daily. Does not watch diet and does no exercise.  4. Diabetic polyneuropathy associated with type 2 diabetes mellitus ([place]) Feet stay numb.   5. Peripheral edema  Has chronic lower ext edema- worsens throughout day and will resolve some at night  6. Morbid obesity ([place])  No recent weight changes  7. GAD (generalized anxiety disorder)  Takes xanax daily- usually at night when she worries the most.  8. Recurrent major depressive disorder, in full remission ([place])  Takes celexa daily and is doing well. Depression screen [facility] 2/9 09/05/2017 03/27/2017 11/09/2016  Decreased Interest 0 0 0  Down, Depressed, Hopeless 0 0 0  PHQ - 2 Score 0 0 0  Altered sleeping - - -  Tired, decreased energy - - -  Change in appetite - - -  Feeling bad or failure about yourself  - - -  Trouble concentrating - - -  Moving slowly or fidgety/restless - - -  Suicidal thoughts - - -  PHQ-9 Score - - -  Difficult doing work/chores - - -       Outpatient Encounter Medications as of 09/05/2017  Medication Sig  . ALPRAZolam (XANAX) 0.25 MG tablet Take 1 tablet (0.25 mg total) by mouth at bedtime.  [name] amLODipine (NORVASC) 5 MG tablet Take 1 tablet (5 mg total) by mouth daily.  [name] atorvastatin (LIPITOR) 40 MG tablet Take 1 tablet (40 mg total)  by mouth daily.  . Cholecalciferol (VITAMIN D3) 2000 UNITS TABS Take 2,000 Int'l Units by mouth daily.    . citalopram (CELEXA) 40 MG tablet Take 1 tablet (40 mg total) by mouth daily.  . furosemide (LASIX) 40 MG tablet Take 1 tablet (40 mg total) by mouth daily. (Patient not taking: Reported on 03/27/2017)  . gabapentin (NEURONTIN) 100 MG capsule Take 1 capsule (100 mg total) by mouth 3 (three) times daily.  [name] glipiZIDE (GLUCOTROL XL) 2.5 MG 24 hr tablet Take 1 tablet (2.5 mg total) by mouth daily.  [name] glucose blood (ACCU-CHEK SMARTVIEW) test strip Test blood glucose up to three times a day  . metFORMIN (GLUCOPHAGE) 1000 MG tablet Take 1 Tablet by mouth 2 times a day with a meal  . metoprolol tartrate (LOPRESSOR) 50 MG tablet Take 1 tablet (50 mg total) by mouth 2 (two) times daily.  . sodium polystyrene (KAYEXALATE) powder 15g daily for 2 days      New complaints: Having some visual problems and has been see eye specialist. Has glaucom and it has really been affecting her vision. Has follow up in 1 month.  Social history: Lives alone- has 2 sons that she is very close to.   Review of Systems  Constitutional: Negative for activity change and appetite change.  HENT: Negative.   Eyes: Negative  for pain.  Respiratory: Negative for shortness of breath.   Cardiovascular: Negative for chest pain, palpitations and leg swelling.  Gastrointestinal: Negative for abdominal pain.  Endocrine: Negative for polydipsia.  Genitourinary: Negative.   Skin: Negative for rash.  Neurological: Negative for dizziness, weakness and headaches.  Hematological: Does not bruise/bleed easily.  Psychiatric/Behavioral: Negative.   All other systems reviewed and are negative.      Objective:   Physical Exam  Constitutional: She is oriented to person, place, and time. She appears well-developed and well-nourished.  HENT:  Head: Normocephalic.  Nose: Nose normal.  Mouth/Throat: Oropharynx is clear and moist.    Eyes: Pupils are equal, round, and reactive to light. EOM are normal.  Neck: Normal range of motion. Neck supple. No JVD present. Carotid bruit is not present.  Cardiovascular: Normal rate, regular rhythm, normal heart sounds and intact distal pulses.  Pulmonary/Chest: Effort normal and breath sounds normal. No respiratory distress. She has no wheezes. She has no rales. She exhibits no tenderness.  Abdominal: Soft. Normal appearance, normal aorta and bowel sounds are normal. She exhibits no distension, no abdominal bruit, no pulsatile midline mass and no mass. There is no splenomegaly or hepatomegaly. There is no tenderness.  Musculoskeletal: Normal range of motion. She exhibits edema (2+edema bil lower ext.).  Lymphadenopathy:    She has no cervical adenopathy.  Neurological: She is alert and oriented to person, place, and time. She has normal reflexes.  Skin: Skin is warm and dry.  Psychiatric: She has a normal mood and affect. Her behavior is normal. Judgment and thought content normal.  Nursing note and vitals reviewed.  BP (!) 141/75   Pulse (!) 54   Temp 97.8 F (36.6 C) (Oral)   Ht '5\' 4"'  (1.626 m)   Wt 227 lb (103 kg)   BMI 38.96 kg/m   hgba1c 7.0%      Assessment & Plan:  Kimberly Mcdowell comes in today with chief complaint of Medical Management of Chronic Issues   Diagnosis and orders addressed:  1. Essential hypertension Low sodium diet - CMP14+EGFR - metoprolol tartrate (LOPRESSOR) 50 MG tablet; Take 1 tablet (50 mg total) by mouth 2 (two) times daily.  Dispense: 180 tablet; Refill: 1 - amLODipine (NORVASC) 5 MG tablet; Take 1 tablet (5 mg total) by mouth daily.  Dispense: 90 tablet; Refill: 3  2. Type 2 diabetes mellitus without complication, with long-term current use of insulin ([name]) Continue to watch carbs in diet - Bayer DCA Hb A1c Waived - Microalbumin / creatinine urine ratio - metFORMIN (GLUCOPHAGE) 1000 MG tablet; Take 1 Tablet by mouth 2 times a day  with a meal  Dispense: 180 tablet; Refill: 1 - glipiZIDE (GLUCOTROL XL) 2.5 MG 24 hr tablet; Take 1 tablet (2.5 mg total) by mouth daily.  Dispense: 90 tablet; Refill: 1  3. Mixed hyperlipidemia Low fat diet - Lipid panel - atorvastatin (LIPITOR) 40 MG tablet; Take 1 tablet (40 mg total) by mouth daily.  Dispense: 90 tablet; Refill: 1  4. Diabetic polyneuropathy associated with type 2 diabetes mellitus ([place]) Do not go barefooted - gabapentin (NEURONTIN) 100 MG capsule; Take 1 capsule (100 mg total) by mouth 3 (three) times daily.  Dispense: 270 capsule; Refill: 1  5. Peripheral edema Elevate legs when sitting - furosemide (LASIX) 40 MG tablet; Take 1 tablet (40 mg total) by mouth daily.  Dispense: 90 tablet; Refill: 1  6. Morbid obesity ([place]) Discussed diet and exercise for person with BMI >25  Will recheck weight in 3-6 months  7. GAD (generalized anxiety disorder) stressmanagement  8. Recurrent major depressive disorder, in full remission ([place]) - citalopram (CELEXA) 40 MG tablet; Take 1 tablet (40 mg total) by mouth daily.  Dispense: 90 tablet; Refill: 1  9. Panic attacks Breathing exercises - ALPRAZolam (XANAX) 0.25 MG tablet; Take 1 tablet (0.25 mg total) by mouth at bedtime.  Dispense: 30 tablet; Refill: 1   Labs pending Health Maintenance reviewed Diet and exercise encouraged  Follow up plan: 3 months   Mary-Margaret Hassell Done, FNP

## 2017-09-05 NOTE — Patient Instructions (Addendum)

## 2017-09-06 LAB — LIPID PANEL
Chol/HDL Ratio: 2.9 ratio (ref 0.0–4.4)
Cholesterol, Total: 125 mg/dL (ref 100–199)
HDL: 43 mg/dL (ref >39)
LDL Calculated: 62 mg/dL (ref 0–99)
Triglycerides: 99 mg/dL (ref 0–149)
VLDL Cholesterol Cal: 20 mg/dL (ref 5–40)

## 2017-09-06 LAB — CMP14+EGFR
A/G RATIO: 1.5 (ref 1.2–2.2)
ALT: 13 IU/L (ref 0–32)
AST: 10 IU/L (ref 0–40)
Albumin: 4 g/dL (ref 3.5–4.7)
Alkaline Phosphatase: 69 IU/L (ref 39–117)
BUN/Creatinine Ratio: 13 (ref 12–28)
BUN: 13 mg/dL (ref 8–27)
Bilirubin Total: 0.5 mg/dL (ref 0.0–1.2)
CALCIUM: 9.1 mg/dL (ref 8.7–10.3)
CO2: 24 mmol/L (ref 20–29)
CREATININE: 1.02 mg/dL — AB (ref 0.57–1.00)
Chloride: 103 mmol/L (ref 96–106)
GFR, EST AFRICAN AMERICAN: 58 mL/min/{1.73_m2} — AB (ref >59)
GFR, EST NON AFRICAN AMERICAN: 51 mL/min/{1.73_m2} — AB (ref >59)
Globulin, Total: 2.6 g/dL (ref 1.5–4.5)
Glucose: 123 mg/dL — ABNORMAL HIGH (ref 65–99)
POTASSIUM: 5.7 mmol/L — AB (ref 3.5–5.2)
Sodium: 140 mmol/L (ref 134–144)
TOTAL PROTEIN: 6.6 g/dL (ref 6.0–8.5)

## 2017-09-06 LAB — MICROALBUMIN / CREATININE URINE RATIO
Creatinine, Urine: 104.4 mg/dL
MICROALB/CREAT RATIO: 19.1 mg/g{creat} (ref 0.0–30.0)
Microalbumin, Urine: 19.9 ug/mL

## 2017-10-01 ENCOUNTER — Ambulatory Visit: Payer: Medicare HMO

## 2017-10-02 ENCOUNTER — Encounter: Payer: Self-pay | Admitting: Nurse Practitioner

## 2017-10-16 ENCOUNTER — Ambulatory Visit (INDEPENDENT_AMBULATORY_CARE_PROVIDER_SITE_OTHER): Payer: Medicare HMO

## 2017-10-16 ENCOUNTER — Ambulatory Visit (INDEPENDENT_AMBULATORY_CARE_PROVIDER_SITE_OTHER): Payer: Medicare HMO | Admitting: Nurse Practitioner

## 2017-10-16 ENCOUNTER — Encounter: Payer: Self-pay | Admitting: Nurse Practitioner

## 2017-10-16 VITALS — BP 148/78 | HR 74 | Temp 98.0°F

## 2017-10-16 DIAGNOSIS — S01412A Laceration without foreign body of left cheek and temporomandibular area, initial encounter: Secondary | ICD-10-CM

## 2017-10-16 DIAGNOSIS — S61402A Unspecified open wound of left hand, initial encounter: Secondary | ICD-10-CM

## 2017-10-16 DIAGNOSIS — S6992XA Unspecified injury of left wrist, hand and finger(s), initial encounter: Secondary | ICD-10-CM | POA: Diagnosis not present

## 2017-10-16 DIAGNOSIS — S66822A Laceration of other specified muscles, fascia and tendons at wrist and hand level, left hand, initial encounter: Secondary | ICD-10-CM | POA: Diagnosis not present

## 2017-10-16 DIAGNOSIS — M79642 Pain in left hand: Secondary | ICD-10-CM | POA: Diagnosis not present

## 2017-10-16 DIAGNOSIS — W19XXXA Unspecified fall, initial encounter: Secondary | ICD-10-CM

## 2017-10-16 NOTE — Progress Notes (Signed)
   Subjective:    Patient ID: Kimberly Mcdowell, female    DOB: January 19, 1931, 84 y.o.   MRN: [id number]   Chief Complaint: Facial Laceration and Extremity Laceration   HPI Patient fell this morning outside and lacerated her left cheek and left hand. No sure what made her fall but she said she did not black out and did hit her head.    Review of Systems  Eyes: Negative for visual disturbance.  Respiratory: Negative.   Cardiovascular: Negative.   Neurological: Negative for dizziness, weakness and headaches.  Psychiatric/Behavioral: Negative.   All other systems reviewed and are negative.      Objective:   Physical Exam  Constitutional: She is oriented to person, place, and time. She appears well-developed and well-nourished. No distress.  Cardiovascular: Normal rate.  Pulmonary/Chest: Effort normal.  Neurological: She is alert and oriented to person, place, and time.  Skin: Skin is warm and dry.  Superficial laceration to left hand 3cm long jagged superficial laceration to left cheek.  Nursing note and vitals reviewed.       Wounds cleaned with saline Steri strips to left hand dermabond to left cheek laceration.  BP (!) 183/80   Pulse 73   Temp 98 F (36.7 C) (Oral)       Assessment & Plan:  Kimberly Mouldelores G Doan in today with chief complaint of Facial Laceration and Extremity Laceration   1. Fall, initial encounter Fall precautions  2. Left hand pain - DG Hand Complete Left  3. Cheek laceration, left, initial encounter Do not oick or scratch at area  4. Extensor tendon laceration of left hand with open wound, initial encounter Keep area clean and dry Do  Not submerge in water rto prn  Mary-Margaret Daphine DeutscherMartin, FNP

## 2017-10-16 NOTE — Patient Instructions (Signed)
Sterile Tape Wound Care °Some cuts and wounds can be closed using sterile tape, also called skin adhesive strips. Skin adhesive strips can be used for shallow (superficial) and simple cuts, wounds, lacerations, and some surgical incisions. These strips act in place of stitches, or in addition to stitches, to hold the edges of the wound together to allow for better healing. Unlike stitches, the adhesive strips do not require needles or anesthetic medicine for placement. The strips usually fall off on their own as the wound is healing. It is important to take proper care of your wound at home while it heals. °How to care for a sterile tape wound °· Try to keep the area around your wound clean and dry. Do not allow the adhesive strips to get wet for the first 12 hours. °· Do not use any soaps or ointments on the wound for the first 12 hours. °· If a bandage (dressing) has been applied, keep it dry. °· Follow instructions from your health care provider about how often to change the dressing. °¨ Wash your hands with soap and water before you change your dressing. If soap and water are not available, use hand sanitizer. °¨ Change your dressing as told by your health care provider. °¨ Leave adhesive strips in place. These skin closures may need to stay in place for 2 weeks or longer. If adhesive strip edges start to loosen and curl up, you may trim the loose edges. Do not remove adhesive strips completely unless your health care provider tells you to do that. °· Do not scratch, rub, or pick at the wound area. °· Protect the wound from further injury until it is healed. °· Protect the wound from sun and tanning bed exposure while it is healing, and for several weeks after healing. °· Check the wound every day for signs of infection. Check for: °¨ More redness, swelling, or pain. °¨ More fluid or blood. °¨ Warmth. °¨ Pus or a bad smell. °Follow these instructions at home: °· Take over-the-counter and prescription medicines  only as told by your health care provider. °· Keep all follow-up visits as told by your health care provider. This is important. °Contact a health care provider if: °· Your adhesive strips become soaked with blood or fall off before the wound has healed. The tape will need to be replaced. °· You have a fever. °Get help right away if: °· You have chills. °· You develop a rash after the strips are applied. °· You have a red streak that goes away from the wound. °· You have more redness, swelling, or pain around your wound. °· You have more fluid or blood coming from your wound. °· Your wound feels warm to the touch. °· You have pus or a bad smell coming from your wound. °· Your wound breaks open. °This information is not intended to replace advice given to you by your health care provider. Make sure you discuss any questions you have with your health care provider. °Document Released: 02/17/2004 Document Revised: 12/03/2015 Document Reviewed: 12/03/2015 °Elsevier Interactive Patient Education © 2017 [employer] ° °

## 2017-12-06 ENCOUNTER — Ambulatory Visit: Payer: Medicare HMO | Admitting: Nurse Practitioner

## 2018-02-06 ENCOUNTER — Other Ambulatory Visit: Payer: Self-pay | Admitting: Nurse Practitioner

## 2018-02-06 DIAGNOSIS — F410 Panic disorder [episodic paroxysmal anxiety] without agoraphobia: Secondary | ICD-10-CM

## 2018-02-06 DIAGNOSIS — E119 Type 2 diabetes mellitus without complications: Secondary | ICD-10-CM

## 2018-02-06 DIAGNOSIS — Z794 Long term (current) use of insulin: Principal | ICD-10-CM

## 2018-02-07 NOTE — Telephone Encounter (Signed)
Last seen 08/09/17

## 2018-02-07 NOTE — Telephone Encounter (Signed)
appt given  

## 2018-02-07 NOTE — Telephone Encounter (Signed)
Last refill without being seen 

## 2018-02-11 ENCOUNTER — Encounter: Payer: Self-pay | Admitting: Nurse Practitioner

## 2018-02-11 ENCOUNTER — Ambulatory Visit (INDEPENDENT_AMBULATORY_CARE_PROVIDER_SITE_OTHER): Payer: Medicare HMO | Admitting: Nurse Practitioner

## 2018-02-11 VITALS — BP 139/84 | HR 76 | Temp 97.6°F | Ht 64.0 in | Wt 228.0 lb

## 2018-02-11 DIAGNOSIS — M1990 Unspecified osteoarthritis, unspecified site: Secondary | ICD-10-CM

## 2018-02-11 DIAGNOSIS — E119 Type 2 diabetes mellitus without complications: Secondary | ICD-10-CM

## 2018-02-11 DIAGNOSIS — E1142 Type 2 diabetes mellitus with diabetic polyneuropathy: Secondary | ICD-10-CM

## 2018-02-11 DIAGNOSIS — E782 Mixed hyperlipidemia: Secondary | ICD-10-CM | POA: Diagnosis not present

## 2018-02-11 DIAGNOSIS — R609 Edema, unspecified: Secondary | ICD-10-CM

## 2018-02-11 DIAGNOSIS — F3342 Major depressive disorder, recurrent, in full remission: Secondary | ICD-10-CM

## 2018-02-11 DIAGNOSIS — R69 Illness, unspecified: Secondary | ICD-10-CM | POA: Diagnosis not present

## 2018-02-11 DIAGNOSIS — Z794 Long term (current) use of insulin: Secondary | ICD-10-CM | POA: Diagnosis not present

## 2018-02-11 DIAGNOSIS — I10 Essential (primary) hypertension: Secondary | ICD-10-CM

## 2018-02-11 DIAGNOSIS — F411 Generalized anxiety disorder: Secondary | ICD-10-CM

## 2018-02-11 LAB — BAYER DCA HB A1C WAIVED: HB A1C (BAYER DCA - WAIVED): 7.1 % — ABNORMAL HIGH (ref <7.0)

## 2018-02-11 MED ORDER — FUROSEMIDE 40 MG PO TABS: 40.0000 mg | ORAL_TABLET | Freq: Every day | ORAL | 1 refills | Status: DC

## 2018-02-11 MED ORDER — GABAPENTIN 100 MG PO CAPS: 100.0000 mg | ORAL_CAPSULE | Freq: Three times a day (TID) | ORAL | 1 refills | Status: DC

## 2018-02-11 MED ORDER — METFORMIN HCL 1000 MG PO TABS: ORAL_TABLET | ORAL | 1 refills | Status: DC

## 2018-02-11 MED ORDER — AMLODIPINE BESYLATE 5 MG PO TABS: 5.0000 mg | ORAL_TABLET | Freq: Every day | ORAL | 1 refills | Status: DC

## 2018-02-11 MED ORDER — CITALOPRAM HYDROBROMIDE 40 MG PO TABS: 40.0000 mg | ORAL_TABLET | Freq: Every day | ORAL | 1 refills | Status: DC

## 2018-02-11 MED ORDER — ALPRAZOLAM 0.25 MG PO TABS: 0.2500 mg | ORAL_TABLET | Freq: Every day | ORAL | 3 refills | Status: DC

## 2018-02-11 MED ORDER — METOPROLOL TARTRATE 50 MG PO TABS: 50.0000 mg | ORAL_TABLET | Freq: Two times a day (BID) | ORAL | 1 refills | Status: DC

## 2018-02-11 MED ORDER — ATORVASTATIN CALCIUM 40 MG PO TABS: 40.0000 mg | ORAL_TABLET | Freq: Every day | ORAL | 1 refills | Status: DC

## 2018-02-11 MED ORDER — GLIPIZIDE ER 2.5 MG PO TB24: ORAL_TABLET | ORAL | 1 refills | Status: DC

## 2018-02-11 NOTE — Patient Instructions (Signed)
Thumb Sprain    A thumb sprain is an injury to one of the bands of tissue (ligaments) that connect the bones in your thumb. The ligament may be stretched too much, or it may be torn. A tear can be either partial or complete. How bad, or severe, the sprain is depends on how much of the ligament was damaged or torn.  What are the causes?  A thumb sprain is often caused by a fall or an accident, such as when you hold your hands out to catch something or to protect yourself.  What increases the risk?  This injury is more likely to occur in people who play sports that involve:  · A risk of falling, such as skiing.  · Catching an object, such as basketball.  What are the signs or symptoms?  Symptoms of this condition include:  · Not being able to move the thumb normally.  · Swelling.  · Tenderness.  · Bruising.  How is this diagnosed?  This condition may be diagnosed based on:  · Your symptoms and medical history. Your health care provider may ask about any recent injuries to your thumb.  · A physical exam.  · Imaging studies such as X-ray, ultrasound, or MRI.  How is this treated?  Treatment for this condition depends on how severe your sprain is.  · If your ligament is overstretched or partially torn, treatment usually involves keeping your thumb in a fixed position (immobilization) for at least 4 to 6 weeks. Your health care provider will apply a bandage, cast, or splint to keep your thumb from moving until it heals.  · If your ligament is fully torn, you may need surgery to reconnect the ligament to the bone. After surgery, you will need to wear a cast or splint on your thumb.  Your health care provider may also recommend physical therapy to strengthen your thumb.  Follow these instructions at home:  If you have a splint or bandage:  · Wear the splint or bandage as told by your health care provider. Remove it only as told by your health care provider.  · Loosen the splint or bandage if your thumb or fingers tingle,  become numb, or turn cold and blue.  · Keep the splint or bandage clean and dry.  If you have a cast:  · Do not stick anything inside the cast to scratch your skin. Doing that increases your risk of infection.  · Check the skin around the cast every day. Tell your health care provider about any concerns.  · You may put lotion on dry skin around the edges of the cast. Do not put lotion on the skin underneath the cast.  · Keep the cast clean and dry.  Bathing  · Do not take baths, swim, or use a hot tub until your health care provider approves. Ask your health care provider if you may take showers. You may only be allowed to take sponge baths.  · If your splint, bandage, or cast is not waterproof:  ? Do not let it get wet.  ? Cover it with a watertight covering to protect it from water when you take a bath or shower.  Managing pain, stiffness, and swelling    · If directed, put ice on your thumb:  ? If you have a removable splint, remove it as told by your health care provider.  ? Put ice in a plastic bag.  ? Place a towel between your skin   and the bag, or between your cast and the bag.  ? Leave the ice on for 20 minutes, 2-3 times a day.  · Move your fingers often to avoid stiffness and to lessen swelling.  · Raise (elevate) your hand above the level of your heart while you are sitting or lying down.  Activity  · Return to your normal activities as told by your health care provider. Ask your health care provider what activities are safe for you.  · Do physical therapy exercises as directed. After your splint or cast is removed, your health care provider may recommend that you:  ? Move your thumb in circles.  ? Touch your thumb to your pinky finger.  ? Do these exercises several times a day.  · Ask your health care provider if you may use a hand exerciser to strengthen your muscles.  · If your thumb feels stiff while you are exercising it, try doing the exercises while soaking your hand in warm water.  Driving  · Do  not drive until your health care provider approves.  · Donot drive or use heavy machinery while taking prescription pain medicine.  General instructions  · Do not put pressure on any part of the cast or splint until it is fully hardened, if applicable. This may take several hours.  · Take over-the-counter and prescription medicines only as told by your health care provider.  · Do not use any products that contain nicotine or tobacco, such as cigarettes and e-cigarettes. These can delay healing. If you need help quitting, ask your health care provider.  · Do not wear rings on your injured thumb.  · Keep all follow-up visits as told by your health care provider. This is important.  Contact a health care provider if you have:  · Pain that gets worse or does not get better with medicine.  · Bruising or swelling that gets worse.  · Your cast or splint is damaged.  Get help right away if:  · Your thumb feels numb, tingles, turns cold, or turns blue, even after loosening your splint or bandage (if applicable).  Summary  · A thumb sprain is an injury to one of the bands of tissue (ligaments) that connect the bones in your thumb.  · Thumb sprains are more likely to occur in people who play sports that involve a risk of falling or having to catch an object.  · Treatment will depend on how severe the sprain is, but it will require keeping the thumb in a fixed position. It might require surgery.  · Make sure you understand and follow all of your health care provider's instructions for home care.  This information is not intended to replace advice given to you by your health care provider. Make sure you discuss any questions you have with your health care provider.  Document Released: 02/17/2004 Document Revised: 02/01/2017 Document Reviewed: 02/01/2017  Elsevier Interactive Patient Education © 2019 [employer]

## 2018-02-11 NOTE — Progress Notes (Signed)
Subjective:    Patient ID: Kimberly Mcdowell, female    DOB: 17-Nov-1930, 85 y.o.   MRN: [id number]   Chief Complaint: Medical Management of Chronic Issues   HPI:  1. Essential hypertension  No c/o chest pain,sobor headache. Does not check blood pressure at home. BP Readings from Last 3 Encounters:  10/16/17 (!) 148/78  09/05/17 (!) 141/75  03/27/17 (!) 128/57     2. Type 2 diabetes mellitus without complication, with long-term current use of insulin ([place])  last hgba1c was 7.0%. she does not check blood sugars very often at home. She just doesn't like dong it. She really does not watch diet very closely/  3. Mixed hyperlipidemia  Really eats whatever she wants to. Does no exercise but does try to stay busy.  4. Peripheral edema  Always has a little edema- worsens if she is on her feet alot  5. Diabetic polyneuropathy associated with type 2 diabetes mellitus ([place]) Feet stay somewhat numb all the time. Mainly in her toes.   6. Arthritis  Has in many joints and walking gets harder and harder, but she says she is not going to let it stop her.  7. Morbid obesity ([place])  No recent weight chnages  8. GAD (generalized anxiety disorder)  Her anxiety seems to be worse at night so she only takes her xanax at bedtime. She worries about people breaking into her house at night  9. Recurrent major depressive disorder, in full remission [facility] ([place]  She has been on celexa for awhile and is doing well. Says she does not as if she is depressed. Depression screen [facility] 2/9 02/11/2018 09/05/2017 03/27/2017  Decreased Interest 0 0 0  Down, Depressed, Hopeless 0 0 0  PHQ - 2 Score 0 0 0  Altered sleeping - - -  Tired, decreased energy - - -  Change in appetite - - -  Feeling bad or failure about yourself  - - -  Trouble concentrating - - -  Moving slowly or fidgety/restless - - -  Suicidal thoughts - - -  PHQ-9 Score - - -  Difficult doing work/chores - - -       Outpatient Encounter Medications as  of 02/11/2018  Medication Sig  . ALPRAZolam (XANAX) 0.25 MG tablet Take 1 tablet (0.25 mg total) by mouth at bedtime.  [name] amLODipine (NORVASC) 5 MG tablet Take 1 tablet (5 mg total) by mouth daily.  [name] atorvastatin (LIPITOR) 40 MG tablet Take 1 tablet (40 mg total) by mouth daily.  . Cholecalciferol (VITAMIN D3) 2000 UNITS TABS Take 2,000 Int'l Units by mouth daily.    . citalopram (CELEXA) 40 MG tablet Take 1 tablet (40 mg total) by mouth daily.  . furosemide (LASIX) 40 MG tablet Take 1 tablet (40 mg total) by mouth daily.  [name] gabapentin (NEURONTIN) 100 MG capsule Take 1 capsule (100 mg total) by mouth 3 (three) times daily.  [name] GLIPIZIDE XL 2.5 MG 24 hr tablet Take 1 tablet (2.5 mg total) by mouth daily.  [name] glucose blood (ACCU-CHEK SMARTVIEW) test strip Test blood glucose up to three times a day  . metFORMIN (GLUCOPHAGE) 1000 MG tablet Take 1 Tablet by mouth 2 times a day with a meal  . metoprolol tartrate (LOPRESSOR) 50 MG tablet Take 1 tablet (50 mg total) by mouth 2 (two) times daily.      New complaints: Pain in left thumb- she has bad arthritis.   Social history: She has had 2 falls  in the last several months   Review of Systems  Constitutional: Negative for activity change and appetite change.  HENT: Negative.   Eyes: Negative for pain.  Respiratory: Negative for shortness of breath.   Cardiovascular: Negative for chest pain, palpitations and leg swelling.  Gastrointestinal: Negative for abdominal pain.  Endocrine: Negative for polydipsia.  Genitourinary: Negative.   Skin: Negative for rash.  Neurological: Negative for dizziness, weakness and headaches.  Hematological: Does not bruise/bleed easily.  Psychiatric/Behavioral: Negative.   All other systems reviewed and are negative.      Objective:   Physical Exam Vitals signs and nursing note reviewed.  Constitutional:      General: She is not in acute distress.    Appearance: Normal appearance. She is well-developed.    HENT:     Head: Normocephalic.     Nose: Nose normal.  Eyes:     Pupils: Pupils are equal, round, and reactive to light.  Neck:     Musculoskeletal: Normal range of motion and neck supple.     Vascular: No carotid bruit or JVD.  Cardiovascular:     Rate and Rhythm: Normal rate and regular rhythm.     Heart sounds: Normal heart sounds.  Pulmonary:     Effort: Pulmonary effort is normal. No respiratory distress.     Breath sounds: Normal breath sounds. No wheezing or rales.  Chest:     Chest wall: No tenderness.  Abdominal:     General: Bowel sounds are normal. There is no distension or abdominal bruit.     Palpations: Abdomen is soft. There is no hepatomegaly, splenomegaly, mass or pulsatile mass.     Tenderness: There is no abdominal tenderness.  Musculoskeletal: Normal range of motion.     Comments: FROM of left thumb with pain on opposition Mild edema at base of left thumb  Lymphadenopathy:     Cervical: No cervical adenopathy.  Skin:    General: Skin is warm and dry.  Neurological:     Mental Status: She is alert and oriented to person, place, and time.     Deep Tendon Reflexes: Reflexes are normal and symmetric.  Psychiatric:        Behavior: Behavior normal.        Thought Content: Thought content normal.        Judgment: Judgment normal.    BP 139/84   Pulse 76   Temp 97.6 F (36.4 C) (Oral)   Ht '5\' 4"'  (1.626 m)   Wt 228 lb (103.4 kg)   BMI 39.14 kg/m   hgba1c 7.1%       Assessment & Plan:  Kimberly Mcdowell comes in today with chief complaint of Medical Management of Chronic Issues and Left hand pain (had xrays in September)   Diagnosis and orders addressed:  1. Essential hypertension Low sodium diet - CMP14+EGFR - Lipid panel - Bayer DCA Hb A1c Waived - metoprolol tartrate (LOPRESSOR) 50 MG tablet; Take 1 tablet (50 mg total) by mouth 2 (two) times daily.  Dispense: 180 tablet; Refill: 1 - amLODipine (NORVASC) 5 MG tablet; Take 1 tablet (5 mg  total) by mouth daily.  Dispense: 90 tablet; Refill: 1  2. Type 2 diabetes mellitus without complication, with long-term current use of insulin ([name]) Stricter carb counting - CMP14+EGFR - Lipid panel - Bayer DCA Hb A1c Waived - glipiZIDE (GLIPIZIDE XL) 2.5 MG 24 hr tablet; Take 1 tablet (2.5 mg total) by mouth daily.  Dispense: 90 tablet; Refill: 1 -  metFORMIN (GLUCOPHAGE) 1000 MG tablet; Take 1 Tablet by mouth 2 times a day with a meal  Dispense: 180 tablet; Refill: 1  3. Mixed hyperlipidemia Low fat diet - CMP14+EGFR - Lipid panel - Bayer DCA Hb A1c Waived - atorvastatin (LIPITOR) 40 MG tablet; Take 1 tablet (40 mg total) by mouth daily.  Dispense: 90 tablet; Refill: 1  4. Peripheral edema Elevate legs when sitting - furosemide (LASIX) 40 MG tablet; Take 1 tablet (40 mg total) by mouth daily.  Dispense: 90 tablet; Refill: 1  5. Diabetic polyneuropathy associated with type 2 diabetes mellitus ([place]) Do not go barefooted - gabapentin (NEURONTIN) 100 MG capsule; Take 1 capsule (100 mg total) by mouth 3 (three) times daily.  Dispense: 270 capsule; Refill: 1  6. Arthritis Thumb spca splint to left thumb  7. Morbid obesity ([place]) Discussed diet and exercise for person with BMI >25 Will recheck weight in 3-6 months  8. GAD (generalized anxiety disorder) stres management - ALPRAZolam (XANAX) 0.25 MG tablet; Take 1 tablet (0.25 mg total) by mouth at bedtime.  Dispense: 30 tablet; Refill: 3  9. Recurrent major depressive disorder, in full remission ([place]) - citalopram (CELEXA) 40 MG tablet; Take 1 tablet (40 mg total) by mouth daily.  Dispense: 90 tablet; Refill: 1   Labs pending Health Maintenance reviewed Diet and exercise encouraged  Follow up plan: 3 months   Mary-Margaret Hassell Done, FNP

## 2018-02-12 LAB — CMP14+EGFR
A/G RATIO: 1.3 (ref 1.2–2.2)
ALBUMIN: 4 g/dL (ref 3.6–4.6)
ALK PHOS: 79 IU/L (ref 39–117)
ALT: 14 IU/L (ref 0–32)
AST: 13 IU/L (ref 0–40)
BILIRUBIN TOTAL: 0.4 mg/dL (ref 0.0–1.2)
BUN/Creatinine Ratio: 13 (ref 12–28)
BUN: 12 mg/dL (ref 8–27)
CHLORIDE: 99 mmol/L (ref 96–106)
CO2: 23 mmol/L (ref 20–29)
Calcium: 9 mg/dL (ref 8.7–10.3)
Creatinine, Ser: 0.96 mg/dL (ref 0.57–1.00)
GFR calc non Af Amer: 54 mL/min/{1.73_m2} — ABNORMAL LOW (ref >59)
GFR, EST AFRICAN AMERICAN: 62 mL/min/{1.73_m2} (ref >59)
GLOBULIN, TOTAL: 3 g/dL (ref 1.5–4.5)
Glucose: 152 mg/dL — ABNORMAL HIGH (ref 65–99)
POTASSIUM: 4.4 mmol/L (ref 3.5–5.2)
Sodium: 140 mmol/L (ref 134–144)
Total Protein: 7 g/dL (ref 6.0–8.5)

## 2018-02-12 LAB — LIPID PANEL
CHOLESTEROL TOTAL: 206 mg/dL — AB (ref 100–199)
Chol/HDL Ratio: 3.8 ratio (ref 0.0–4.4)
HDL: 54 mg/dL (ref >39)
LDL Calculated: 122 mg/dL — ABNORMAL HIGH (ref 0–99)
TRIGLYCERIDES: 151 mg/dL — AB (ref 0–149)
VLDL CHOLESTEROL CAL: 30 mg/dL (ref 5–40)

## 2018-02-19 DIAGNOSIS — E1142 Type 2 diabetes mellitus with diabetic polyneuropathy: Secondary | ICD-10-CM | POA: Diagnosis not present

## 2018-02-19 DIAGNOSIS — Z6838 Body mass index (BMI) 38.0-38.9, adult: Secondary | ICD-10-CM | POA: Diagnosis not present

## 2018-02-19 DIAGNOSIS — I509 Heart failure, unspecified: Secondary | ICD-10-CM | POA: Diagnosis not present

## 2018-02-19 DIAGNOSIS — E785 Hyperlipidemia, unspecified: Secondary | ICD-10-CM | POA: Diagnosis not present

## 2018-02-19 DIAGNOSIS — M1990 Unspecified osteoarthritis, unspecified site: Secondary | ICD-10-CM | POA: Diagnosis not present

## 2018-02-19 DIAGNOSIS — R32 Unspecified urinary incontinence: Secondary | ICD-10-CM | POA: Diagnosis not present

## 2018-02-19 DIAGNOSIS — R69 Illness, unspecified: Secondary | ICD-10-CM | POA: Diagnosis not present

## 2018-02-19 DIAGNOSIS — E669 Obesity, unspecified: Secondary | ICD-10-CM | POA: Diagnosis not present

## 2018-02-19 DIAGNOSIS — I110 Hypertensive heart disease with heart failure: Secondary | ICD-10-CM | POA: Diagnosis not present

## 2018-04-01 DIAGNOSIS — H35341 Macular cyst, hole, or pseudohole, right eye: Secondary | ICD-10-CM | POA: Diagnosis not present

## 2018-04-01 DIAGNOSIS — H402223 Chronic angle-closure glaucoma, left eye, severe stage: Secondary | ICD-10-CM | POA: Diagnosis not present

## 2018-05-23 ENCOUNTER — Other Ambulatory Visit: Payer: Self-pay

## 2018-05-23 ENCOUNTER — Ambulatory Visit (INDEPENDENT_AMBULATORY_CARE_PROVIDER_SITE_OTHER): Payer: Medicare HMO | Admitting: Nurse Practitioner

## 2018-05-23 ENCOUNTER — Encounter: Payer: Self-pay | Admitting: Nurse Practitioner

## 2018-05-23 DIAGNOSIS — I10 Essential (primary) hypertension: Secondary | ICD-10-CM

## 2018-05-23 DIAGNOSIS — E119 Type 2 diabetes mellitus without complications: Secondary | ICD-10-CM

## 2018-05-23 DIAGNOSIS — E782 Mixed hyperlipidemia: Secondary | ICD-10-CM

## 2018-05-23 DIAGNOSIS — F411 Generalized anxiety disorder: Secondary | ICD-10-CM

## 2018-05-23 DIAGNOSIS — E1142 Type 2 diabetes mellitus with diabetic polyneuropathy: Secondary | ICD-10-CM | POA: Diagnosis not present

## 2018-05-23 DIAGNOSIS — Z794 Long term (current) use of insulin: Secondary | ICD-10-CM

## 2018-05-23 DIAGNOSIS — F3342 Major depressive disorder, recurrent, in full remission: Secondary | ICD-10-CM

## 2018-05-23 DIAGNOSIS — R609 Edema, unspecified: Secondary | ICD-10-CM

## 2018-05-23 DIAGNOSIS — R69 Illness, unspecified: Secondary | ICD-10-CM | POA: Diagnosis not present

## 2018-05-23 MED ORDER — AMLODIPINE BESYLATE 5 MG PO TABS: 5.0000 mg | ORAL_TABLET | Freq: Every day | ORAL | 1 refills | Status: DC

## 2018-05-23 MED ORDER — METFORMIN HCL 1000 MG PO TABS: ORAL_TABLET | ORAL | 1 refills | Status: DC

## 2018-05-23 MED ORDER — METOPROLOL TARTRATE 50 MG PO TABS: 50.0000 mg | ORAL_TABLET | Freq: Two times a day (BID) | ORAL | 1 refills | Status: DC

## 2018-05-23 MED ORDER — GABAPENTIN 100 MG PO CAPS: 100.0000 mg | ORAL_CAPSULE | Freq: Three times a day (TID) | ORAL | 1 refills | Status: DC

## 2018-05-23 MED ORDER — CITALOPRAM HYDROBROMIDE 40 MG PO TABS: 40.0000 mg | ORAL_TABLET | Freq: Every day | ORAL | 1 refills | Status: DC

## 2018-05-23 MED ORDER — ATORVASTATIN CALCIUM 40 MG PO TABS: 40.0000 mg | ORAL_TABLET | Freq: Every day | ORAL | 1 refills | Status: DC

## 2018-05-23 MED ORDER — GLIPIZIDE ER 2.5 MG PO TB24: ORAL_TABLET | ORAL | 1 refills | Status: DC

## 2018-05-23 MED ORDER — ALPRAZOLAM 0.25 MG PO TABS: 0.2500 mg | ORAL_TABLET | Freq: Every day | ORAL | 3 refills | Status: DC

## 2018-05-23 MED ORDER — FUROSEMIDE 40 MG PO TABS: 40.0000 mg | ORAL_TABLET | Freq: Every day | ORAL | 1 refills | Status: DC

## 2018-05-23 NOTE — Progress Notes (Signed)
Patient ID: Kimberly Mcdowell, female   DOB: 05-Nov-1930, 85 y.o.   MRN: [id number]    Virtual Visit via telephone Note  I connected with Kimberly Mcdowell on 05/23/18 at 4:10 PM by telephone and verified that I am speaking with the correct person using two identifiers. Kimberly Mcdowell is currently located at home and no one is currently with her during visit. The provider, Mary-Margaret Daphine Deutscher, FNP is located in their office at time of visit.  I discussed the limitations, risks, security and privacy concerns of performing an evaluation and management service by telephone and the availability of in person appointments. I also discussed with the patient that there may be a patient responsible charge related to this service. The patient expressed understanding and agreed to proceed.   History and Present Illness:   Chief Complaint: Medical Management of Chronic Issues    HPI:  1. Essential hypertension No c/o chest pain, sob or headache. She does not check blood pressure at home BP Readings from Last 3 Encounters:  02/11/18 139/84  10/16/17 (!) 148/78  09/05/17 (!) 141/75     2. Mixed hyperlipidemia She does not watch diet and does no exercise  3. Type 2 diabetes mellitus without complication, with long-term current use of insulin ([name]) Last hgab1c was 7.1%. her fasting blood sugar range from 120-160. She denies any low blood sugars  4. Diabetic polyneuropathy associated with type 2 diabetes mellitus ([name]) Has constant numbness in bil feet. She checks her feet daily.  5. Peripheral edema Always has slight edema bil lower ext. Elevating legs helps  6. GAD (generalized anxiety disorder) takes xanax at bedtime. That seems to be the only time she is anxious.  7. Recurrent major depressive disorder, in full remission ([name]) She is currentl on celexa daily. No side effets. Depression screen [facility]: [facility] Rhonda W 2/9 05/23/2018 02/11/2018 09/05/2017  Decreased Interest 0 0 0  Down, Depressed, Hopeless 0 0  0  PHQ - 2 Score 0 0 0  Altered sleeping - - -  Tired, decreased energy - - -  Change in appetite - - -  Feeling bad or failure about yourself  - - -  Trouble concentrating - - -  Moving slowly or fidgety/restless - - -  Suicidal thoughts - - -  PHQ-9 Score - - -  Difficult doing work/chores - - -     8. Morbid obesity ([name]) No recent weight changes    Outpatient Encounter Medications as of 05/23/2018  Medication Sig  . ALPRAZolam (XANAX) 0.25 MG tablet Take 1 tablet (0.25 mg total) by mouth at bedtime.  [name] amLODipine (NORVASC) 5 MG tablet Take 1 tablet (5 mg total) by mouth daily.  [name] atorvastatin (LIPITOR) 40 MG tablet Take 1 tablet (40 mg total) by mouth daily.  . Cholecalciferol (VITAMIN D3) 2000 UNITS TABS Take 2,000 Int'l Units by mouth daily.    . citalopram (CELEXA) 40 MG tablet Take 1 tablet (40 mg total) by mouth daily.  . furosemide (LASIX) 40 MG tablet Take 1 tablet (40 mg total) by mouth daily.  [name] gabapentin (NEURONTIN) 100 MG capsule Take 1 capsule (100 mg total) by mouth 3 (three) times daily.  [name] glipiZIDE (GLIPIZIDE XL) 2.5 MG 24 hr tablet Take 1 tablet (2.5 mg total) by mouth daily.  [name] glucose blood (ACCU-CHEK SMARTVIEW) test strip Test blood glucose up to three times a day  . metFORMIN (GLUCOPHAGE) 1000 MG tablet Take 1 Tablet by mouth 2 times a day with a  meal  . metoprolol tartrate (LOPRESSOR) 50 MG tablet Take 1 tablet (50 mg total) by mouth 2 (two) times daily.      New complaints: Patient said she fell the other day. Said she tripped over a cord and landed  On her nee, but it is not bothering.  Social history: Lives by herself- her sons sheck on her every day       Review of Systems  Constitutional: Negative for diaphoresis and weight loss.  Eyes: Negative for blurred vision, double vision and pain.  Respiratory: Negative for shortness of breath.   Cardiovascular: Positive for leg swelling. Negative for chest pain, palpitations and orthopnea.   Gastrointestinal: Negative for abdominal pain.  Skin: Negative for rash.  Neurological: Negative for dizziness, sensory change, loss of consciousness, weakness and headaches.  Endo/Heme/Allergies: Negative for polydipsia. Does not bruise/bleed easily.  Psychiatric/Behavioral: Negative for memory loss. The patient does not have insomnia.   All other systems reviewed and are negative.    Observations/Objective: Alert and oriented- answers all questions appropriately No distress noted  Assessment and Plan: Kimberly Mcdowell comes in today with chief complaint of Medical Management of Chronic Issues   Diagnosis and orders addressed:  1. Essential hypertension wath sodium in diet - metoprolol tartrate (LOPRESSOR) 50 MG tablet; Take 1 tablet (50 mg total) by mouth 2 (two) times daily.  Dispense: 180 tablet; Refill: 1 - amLODipine (NORVASC) 5 MG tablet; Take 1 tablet (5 mg total) by mouth daily.  Dispense: 90 tablet; Refill: 1  2. Mixed hyperlipidemia Low fat diet - atorvastatin (LIPITOR) 40 MG tablet; Take 1 tablet (40 mg total) by mouth daily.  Dispense: 90 tablet; Refill: 1  3. Type 2 diabetes mellitus without complication, with long-term current use of insulin ([name]) Continue to watch carbs in diet - metFORMIN (GLUCOPHAGE) 1000 MG tablet; Take 1 Tablet by mouth 2 times a day with a meal  Dispense: 180 tablet; Refill: 1 - glipiZIDE (GLIPIZIDE XL) 2.5 MG 24 hr tablet; Take 1 tablet (2.5 mg total) by mouth daily.  Dispense: 90 tablet; Refill: 1  4. Diabetic polyneuropathy associated with type 2 diabetes mellitus ([name]) - gabapentin (NEURONTIN) 100 MG capsule; Take 1 capsule (100 mg total) by mouth 3 (three) times daily.  Dispense: 270 capsule; Refill: 1  5. Peripheral edema Elevate legs when sitting - furosemide (LASIX) 40 MG tablet; Take 1 tablet (40 mg total) by mouth daily.  Dispense: 90 tablet; Refill: 1  6. GAD (generalized anxiety disorder) Stress management - ALPRAZolam  (XANAX) 0.25 MG tablet; Take 1 tablet (0.25 mg total) by mouth at bedtime.  Dispense: 30 tablet; Refill: 3  7. Recurrent major depressive disorder, in full remission ([name]) - citalopram (CELEXA) 40 MG tablet; Take 1 tablet (40 mg total) by mouth daily.  Dispense: 90 tablet; Refill: 1  8. Morbid obesity ([name]) Discussed diet and exercise for person with BMI >25 Will recheck weight in 3-6 months    Previous labs results reviewed Health Maintenance reviewed Diet and exercise encouraged  Follow up plan: 3 months     I discussed the assessment and treatment plan with the patient. The patient was provided an opportunity to ask questions and all were answered. The patient agreed with the plan and demonstrated an understanding of the instructions.   The patient was advised to call back or seek an in-person evaluation if the symptoms worsen or if the condition fails to improve as anticipated.  The above assessment and management plan was discussed  with the patient. The patient verbalized understanding of and has agreed to the management plan. Patient is aware to call the clinic if symptoms persist or worsen. Patient is aware when to return to the clinic for a follow-up visit. Patient educated on when it is appropriate to go to the emergency department.    I provided 15 minutes of non-face-to-face time during this encounter.    Mary-Margaret Daphine Deutscher, FNP

## 2018-06-07 ENCOUNTER — Other Ambulatory Visit: Payer: Self-pay

## 2018-06-07 ENCOUNTER — Telehealth: Payer: Self-pay | Admitting: Nurse Practitioner

## 2018-06-07 ENCOUNTER — Ambulatory Visit (INDEPENDENT_AMBULATORY_CARE_PROVIDER_SITE_OTHER): Payer: Medicare HMO | Admitting: Family Medicine

## 2018-06-07 ENCOUNTER — Encounter: Payer: Self-pay | Admitting: Family Medicine

## 2018-06-07 DIAGNOSIS — R441 Visual hallucinations: Secondary | ICD-10-CM | POA: Diagnosis not present

## 2018-06-07 DIAGNOSIS — E1142 Type 2 diabetes mellitus with diabetic polyneuropathy: Secondary | ICD-10-CM

## 2018-06-07 MED ORDER — GABAPENTIN 100 MG PO CAPS: 100.0000 mg | ORAL_CAPSULE | Freq: Every day | ORAL | 3 refills | Status: DC

## 2018-06-07 NOTE — Telephone Encounter (Signed)
Scheduled pt Televisit with Kari Baars today at 3:50.

## 2018-06-07 NOTE — Telephone Encounter (Signed)
PT called in stating that her gabpentin she believes is making her hallucinate. States that she has had couple episodes this week, was wanting to talk to MMM about it, scheduled pt for televist on 5/19 with MMM. Pt states that her son was wanting her to call and talk to MMM because it is giving him a "fit" with her hallucinating.

## 2018-06-08 NOTE — Progress Notes (Deleted)
Subjective:  Patient ID: Kimberly Mcdowell, female    DOB: 1930-05-03, 85 y.o.   MRN: [id number]  Chief Complaint:  Hallucinations   HPI: Kimberly Mcdowell is a 85 y.o. female presenting on 06/07/2018 for Hallucinations  HPI   Relevant past medical, surgical, family, and social history reviewed and updated as indicated.  Allergies and medications reviewed and updated.   Past Medical History:  Diagnosis Date  . Arthritis   . Diabetes mellitus without complication ([place])   . Diabetic neuropathy ([place])   . Hyperlipidemia   . Hypertension   . Hypokalemia   . Osteoporosis     Past Surgical History:  Procedure Laterality Date  . ABDOMINAL HYSTERECTOMY    . APPENDECTOMY    . CHOLECYSTECTOMY    . FRACTURE SURGERY      Social History   Socioeconomic History  . Marital status: Widowed    Spouse name: Not on file  . Number of children: Not on file  . Years of education: Not on file  . Highest education level: Not on file  Occupational History  . Not on file  Social Needs  . Financial resource strain: Not on file  . Food insecurity:    Worry: Not on file    Inability: Not on file  . Transportation needs:    Medical: Not on file    Non-medical: Not on file  Tobacco Use  . Smoking status: Former Research scientist (life sciences)  . Smokeless tobacco: Never Used  Substance and Sexual Activity  . Alcohol use: No  . Drug use: No  . Sexual activity: Not on file  Lifestyle  . Physical activity:    Days per week: Not on file    Minutes per session: Not on file  . Stress: Not on file  Relationships  . Social connections:    Talks on phone: Not on file    Gets together: Not on file    Attends religious service: Not on file    Active member of club or organization: Not on file    Attends meetings of clubs or organizations: Not on file    Relationship status: Not on file  . Intimate partner violence:    Fear of current or ex partner: Not on file    Emotionally abused: Not on file   Physically abused: Not on file    Forced sexual activity: Not on file  Other Topics Concern  . Not on file  Social History Narrative  . Not on file    Outpatient Encounter Medications as of 06/07/2018  Medication Sig  . ALPRAZolam (XANAX) 0.25 MG tablet Take 1 tablet (0.25 mg total) by mouth at bedtime.  [name] amLODipine (NORVASC) 5 MG tablet Take 1 tablet (5 mg total) by mouth daily.  [name] atorvastatin (LIPITOR) 40 MG tablet Take 1 tablet (40 mg total) by mouth daily.  . Cholecalciferol (VITAMIN D3) 2000 UNITS TABS Take 2,000 Int'l Units by mouth daily.    . citalopram (CELEXA) 40 MG tablet Take 1 tablet (40 mg total) by mouth daily.  . furosemide (LASIX) 40 MG tablet Take 1 tablet (40 mg total) by mouth daily.  [name] gabapentin (NEURONTIN) 100 MG capsule Take 1 capsule (100 mg total) by mouth at bedtime.  [name] glipiZIDE (GLIPIZIDE XL) 2.5 MG 24 hr tablet Take 1 tablet (2.5 mg total) by mouth daily.  [name] glucose blood (ACCU-CHEK SMARTVIEW) test strip Test blood glucose up to three times a day  . metFORMIN (GLUCOPHAGE) 1000  MG tablet Take 1 Tablet by mouth 2 times a day with a meal  . metoprolol tartrate (LOPRESSOR) 50 MG tablet Take 1 tablet (50 mg total) by mouth 2 (two) times daily.  . [DISCONTINUED] gabapentin (NEURONTIN) 100 MG capsule Take 1 capsule (100 mg total) by mouth 3 (three) times daily.   No facility-administered encounter medications on file as of 06/07/2018.     Allergies  Allergen Reactions  . Ezetimibe-Simvastatin Other (See Comments)    Muscle aches  . Lipitor [Atorvastatin Calcium] Other (See Comments)    Muscle aches    Review of Systems      Objective:  There were no vitals taken for this visit.   Wt Readings from Last 3 Encounters:  02/11/18 228 lb (103.4 kg)  09/05/17 227 lb (103 kg)  03/27/17 229 lb 6.4 oz (104.1 kg)    Physical Exam  Results for orders placed or performed in visit on 02/11/18  CMP14+EGFR  Result Value Ref Range   Glucose 152 (H) 65 - 99  mg/dL   BUN 12 8 - 27 mg/dL   Creatinine, Ser 0.96 0.57 - 1.00 mg/dL   GFR calc non Af Amer 54 (L) >59 mL/min/1.73   GFR calc Af Amer 62 >59 mL/min/1.73   BUN/Creatinine Ratio 13 12 - 28   Sodium 140 134 - 144 mmol/L   Potassium 4.4 3.5 - 5.2 mmol/L   Chloride 99 96 - 106 mmol/L   CO2 23 20 - 29 mmol/L   Calcium 9.0 8.7 - 10.3 mg/dL   Total Protein 7.0 6.0 - 8.5 g/dL   Albumin 4.0 3.6 - 4.6 g/dL   Globulin, Total 3.0 1.5 - 4.5 g/dL   Albumin/Globulin Ratio 1.3 1.2 - 2.2   Bilirubin Total 0.4 0.0 - 1.2 mg/dL   Alkaline Phosphatase 79 39 - 117 IU/L   AST 13 0 - 40 IU/L   ALT 14 0 - 32 IU/L  Lipid panel  Result Value Ref Range   Cholesterol, Total 206 (H) 100 - 199 mg/dL   Triglycerides 151 (H) 0 - 149 mg/dL   HDL 54 >39 mg/dL   VLDL Cholesterol Cal 30 5 - 40 mg/dL   LDL Calculated 122 (H) 0 - 99 mg/dL   Chol/HDL Ratio 3.8 0.0 - 4.4 ratio  Bayer DCA Hb A1c Waived  Result Value Ref Range   HB A1C (BAYER DCA - WAIVED) 7.1 (H) <7.0 %       Pertinent labs & imaging results that were available during my care of the patient were reviewed by me and considered in my medical decision making.  Assessment & Plan:  Kimberly Mcdowell was seen today for hallucinations.  Diagnoses and all orders for this visit:  Hallucination, visual Increasing visual hallucinations over time. Pt reports this started after initiation of gabapentin. Will trial gabapentin once at bedtime instead of three times daily. Follow up in one week or sooner if hallucinations become worse or new symptoms arise.  -     gabapentin (NEURONTIN) 100 MG capsule; Take 1 capsule (100 mg total) by mouth at bedtime.  Diabetic polyneuropathy associated with type 2 diabetes mellitus ([name]) Due to visual hallucinations, will decrease gabapentin to once at bedtime.  -     gabapentin (NEURONTIN) 100 MG capsule; Take 1 capsule (100 mg total) by mouth at bedtime.     Continue all other maintenance medications.  Follow up plan: Return in  about 1 week (around 06/14/2018), or if symptoms worsen or fail to  improve, for hallucinations.    The above assessment and management plan was discussed with the patient. The patient verbalized understanding of and has agreed to the management plan. Patient is aware to call the clinic if symptoms persist or worsen. Patient is aware when to return to the clinic for a follow-up visit. Patient educated on when it is appropriate to go to the emergency department.   Monia Pouch, FNP-C [facility] [phone]

## 2018-06-08 NOTE — Progress Notes (Addendum)
Virtual Visit via telephone Note Due to COVID-19, visit is conducted virtually and was requested by patient. This visit type was conducted due to national recommendations for restrictions regarding the COVID-19 Pandemic (e.g. social distancing) in an effort to limit this patient's exposure and mitigate transmission in our community. All issues noted in this document were discussed and addressed.  A physical exam was not performed with this format.   I connected with Kimberly Mcdowell on 06/07/18 at 1530 by telephone and verified that I am speaking with the correct person using two identifiers. Kimberly Mcdowell is currently located at home and family is currently with them during visit. The provider, Kari Baars, FNP is located in their office at time of visit.  I discussed the limitations, risks, security and privacy concerns of performing an evaluation and management service by telephone and the availability of in person appointments. I also discussed with the patient that there may be a patient responsible charge related to this service. The patient expressed understanding and agreed to proceed.  Subjective:  Patient ID: Kimberly Mcdowell, female    DOB: 11-28-1930, 85 y.o.   MRN: [id number]  Chief Complaint:  Hallucinations   HPI: Kimberly Mcdowell is a 85 y.o. female presenting on 06/07/2018 for Hallucinations   Pt reports she has been having increased visual hallucinations. She states this has happened to her in the past after the initiation of gabapentin. She states it got better for a little while and has since returned. She states her children are worried because she is having the hallucinations. She denies auditory hallucinations. States she will just see random people in her house or yard. She denies any thoughts or hurting herself or anyone else. She says she does nor feel fearful. States the hallucinations happen at random and are sometimes worse at night. No other associated sings or  symptoms. No fever, chills, confusion, weakness, or fatigue.     Relevant past medical, surgical, family, and social history reviewed and updated as indicated.  Allergies and medications reviewed and updated.   Past Medical History:  Diagnosis Date  . Arthritis   . Diabetes mellitus without complication ([name])   . Diabetic neuropathy ([name])   . Hyperlipidemia   . Hypertension   . Hypokalemia   . Osteoporosis     Past Surgical History:  Procedure Laterality Date  . ABDOMINAL HYSTERECTOMY    . APPENDECTOMY    . CHOLECYSTECTOMY    . FRACTURE SURGERY      Social History   Socioeconomic History  . Marital status: Widowed    Spouse name: Not on file  . Number of children: Not on file  . Years of education: Not on file  . Highest education level: Not on file  Occupational History  . Not on file  Social Needs  . Financial resource strain: Not on file  . Food insecurity:    Worry: Not on file    Inability: Not on file  . Transportation needs:    Medical: Not on file    Non-medical: Not on file  Tobacco Use  . Smoking status: Former Games developer  . Smokeless tobacco: Never Used  Substance and Sexual Activity  . Alcohol use: No  . Drug use: No  . Sexual activity: Not on file  Lifestyle  . Physical activity:    Days per week: Not on file    Minutes per session: Not on file  . Stress: Not on file  Relationships  .  Social connections:    Talks on phone: Not on file    Gets together: Not on file    Attends religious service: Not on file    Active member of club or organization: Not on file    Attends meetings of clubs or organizations: Not on file    Relationship status: Not on file  . Intimate partner violence:    Fear of current or ex partner: Not on file    Emotionally abused: Not on file    Physically abused: Not on file    Forced sexual activity: Not on file  Other Topics Concern  . Not on file  Social History Narrative  . Not on file    Outpatient Encounter  Medications as of 06/07/2018  Medication Sig  . ALPRAZolam (XANAX) 0.25 MG tablet Take 1 tablet (0.25 mg total) by mouth at bedtime.  [name] amLODipine (NORVASC) 5 MG tablet Take 1 tablet (5 mg total) by mouth daily.  [name] atorvastatin (LIPITOR) 40 MG tablet Take 1 tablet (40 mg total) by mouth daily.  . Cholecalciferol (VITAMIN D3) 2000 UNITS TABS Take 2,000 Int'l Units by mouth daily.    . citalopram (CELEXA) 40 MG tablet Take 1 tablet (40 mg total) by mouth daily.  . furosemide (LASIX) 40 MG tablet Take 1 tablet (40 mg total) by mouth daily.  [name] gabapentin (NEURONTIN) 100 MG capsule Take 1 capsule (100 mg total) by mouth at bedtime.  [name] glipiZIDE (GLIPIZIDE XL) 2.5 MG 24 hr tablet Take 1 tablet (2.5 mg total) by mouth daily.  [name] glucose blood (ACCU-CHEK SMARTVIEW) test strip Test blood glucose up to three times a day  . metFORMIN (GLUCOPHAGE) 1000 MG tablet Take 1 Tablet by mouth 2 times a day with a meal  . metoprolol tartrate (LOPRESSOR) 50 MG tablet Take 1 tablet (50 mg total) by mouth 2 (two) times daily.  . [DISCONTINUED] gabapentin (NEURONTIN) 100 MG capsule Take 1 capsule (100 mg total) by mouth 3 (three) times daily.   No facility-administered encounter medications on file as of 06/07/2018.     Allergies  Allergen Reactions  . Ezetimibe-Simvastatin Other (See Comments)    Muscle aches  . Lipitor [Atorvastatin Calcium] Other (See Comments)    Muscle aches    Review of Systems  Constitutional: Negative for activity change, appetite change, chills, diaphoresis, fatigue, fever and unexpected weight change.  Eyes: Negative for photophobia and visual disturbance.  Respiratory: Negative for cough, chest tightness and shortness of breath.   Cardiovascular: Negative for chest pain, palpitations and leg swelling.  Gastrointestinal: Negative for abdominal pain, constipation, diarrhea, nausea and vomiting.  Endocrine: Negative for polydipsia, polyphagia and polyuria.  Genitourinary: Negative for  decreased urine volume, difficulty urinating, dysuria and flank pain.  Musculoskeletal: Positive for myalgias.  Skin: Negative for color change, pallor, rash and wound.  Neurological: Negative for dizziness, tremors, seizures, syncope, facial asymmetry, speech difficulty, weakness, light-headedness, numbness and headaches.  Psychiatric/Behavioral: Positive for hallucinations. Negative for confusion, self-injury and suicidal ideas. The patient is not nervous/anxious.   All other systems reviewed and are negative.        Observations/Objective: No vital signs or physical exam, this was a telephone or virtual health encounter.  Pt alert and oriented, answers all questions appropriately, and able to speak in full sentences.    Assessment and Plan: Kimberly Mcdowell was seen today for hallucinations.  Diagnoses and all orders for this visit:  Hallucination, visual Increasing visual hallucinations over time. Pt reports this started after  initiation of gabapentin. Will trial gabapentin once at bedtime instead of three times daily. Follow up in one week or sooner if hallucinations become worse or new symptoms arise.  -     gabapentin (NEURONTIN) 100 MG capsule; Take 1 capsule (100 mg total) by mouth at bedtime.  Diabetic polyneuropathy associated with type 2 diabetes mellitus ([name]) Due to visual hallucinations, will decrease gabapentin to once at bedtime.  -     gabapentin (NEURONTIN) 100 MG capsule; Take 1 capsule (100 mg total) by mouth at bedtime.     Follow Up Instructions: Return in about 1 week (around 06/14/2018), or if symptoms worsen or fail to improve, for hallucinations.    I discussed the assessment and treatment plan with the patient. The patient was provided an opportunity to ask questions and all were answered. The patient agreed with the plan and demonstrated an understanding of the instructions.   The patient was advised to call back or seek an in-person evaluation if the symptoms  worsen or if the condition fails to improve as anticipated.  The above assessment and management plan was discussed with the patient. The patient verbalized understanding of and has agreed to the management plan. Patient is aware to call the clinic if symptoms persist or worsen. Patient is aware when to return to the clinic for a follow-up visit. Patient educated on when it is appropriate to go to the emergency department.    I provided 15 minutes of non-face-to-face time during this encounter. The call started at 1530. The call ended at 1545. The other time was used for coordination of care.    Kari BaarsMichelle Ayaan Ringle, FNP-C [facility] [address] [place], [place] [address] [phone]

## 2018-06-11 ENCOUNTER — Encounter: Payer: Self-pay | Admitting: Nurse Practitioner

## 2018-06-11 ENCOUNTER — Ambulatory Visit (INDEPENDENT_AMBULATORY_CARE_PROVIDER_SITE_OTHER): Payer: Medicare HMO | Admitting: Nurse Practitioner

## 2018-06-11 ENCOUNTER — Other Ambulatory Visit: Payer: Self-pay

## 2018-06-11 DIAGNOSIS — R441 Visual hallucinations: Secondary | ICD-10-CM | POA: Diagnosis not present

## 2018-06-11 DIAGNOSIS — T50905A Adverse effect of unspecified drugs, medicaments and biological substances, initial encounter: Secondary | ICD-10-CM | POA: Diagnosis not present

## 2018-06-11 MED ORDER — GABAPENTIN 100 MG PO CAPS: 100.0000 mg | ORAL_CAPSULE | Freq: Every day | ORAL | 3 refills | Status: DC

## 2018-06-11 NOTE — Progress Notes (Signed)
   Virtual Visit via telephone Note  I connected with Kimberly Mcdowell on 06/11/18 at 11:10 AM by telephone and verified that I am speaking with the correct person using two identifiers. Kimberly Mcdowell is currently located at home  and no one is currently with her during visit. The provider, Mary-Margaret Daphine Deutscher, FNP is located in their office at time of visit.  I discussed the limitations, risks, security and privacy concerns of performing an evaluation and management service by telephone and the availability of in person appointments. I also discussed with the patient that there may be a patient responsible charge related to this service. The patient expressed understanding and agreed to proceed.   History and Present Illness:   Chief Complaint: Medication Problem   HPI Patient calls in today c/o hallucinations. She said the last several months she has been seeing people in her house at night and people knocking on her door. She says she actually has talked to whoever it was but they did not reply. She called 911 and the police came and said no one was there. She spoke with Marko Stai , NP over the weekend and she told her to cut back on gabapentin. She cut back to only one an day and she says she has been doing better.    Review of Systems  Constitutional: Negative.   Respiratory: Negative.   Cardiovascular: Negative.   Genitourinary: Negative.   Neurological: Negative.   Psychiatric/Behavioral: Positive for hallucinations.  All other systems reviewed and are negative.    Observations/Objective: Alert and oriented- answer all questions appropriately No distress  Assessment and Plan: Kimberly Mcdowell in today with chief complaint of Medication Problem   1. Hallucination, visual **- gabapentin (NEURONTIN) 100 MG capsule; Take 1 capsule (100 mg total) by mouth at bedtime.  Dispense: 90 capsule; Refill: 3  2. Adverse effect of drug, initial encounter patinet will continue t with  1 neurontin nightly- if starts having reports of hallucinations again she will stop it all together and give [place] a call.  Follow Up Instructions: prn    I discussed the assessment and treatment plan with the patient. The patient was provided an opportunity to ask questions and all were answered. The patient agreed with the plan and demonstrated an understanding of the instructions.   The patient was advised to call back or seek an in-person evaluation if the symptoms worsen or if the condition fails to improve as anticipated.  The above assessment and management plan was discussed with the patient. The patient verbalized understanding of and has agreed to the management plan. Patient is aware to call the clinic if symptoms persist or worsen. Patient is aware when to return to the clinic for a follow-up visit. Patient educated on when it is appropriate to go to the emergency department.   Time call ended:  11:25  I provided 15 minutes of non-face-to-face time during this encounter.    Mary-Margaret Daphine Deutscher, FNP

## 2018-10-22 ENCOUNTER — Other Ambulatory Visit: Payer: Self-pay

## 2018-10-22 ENCOUNTER — Encounter: Payer: Self-pay | Admitting: Nurse Practitioner

## 2018-10-22 ENCOUNTER — Ambulatory Visit (INDEPENDENT_AMBULATORY_CARE_PROVIDER_SITE_OTHER): Payer: Medicare HMO | Admitting: Nurse Practitioner

## 2018-10-22 DIAGNOSIS — J40 Bronchitis, not specified as acute or chronic: Secondary | ICD-10-CM

## 2018-10-22 MED ORDER — PREDNISONE 20 MG PO TABS: ORAL_TABLET | ORAL | 0 refills | Status: DC

## 2018-10-22 MED ORDER — CEPHALEXIN 500 MG PO CAPS: 500.0000 mg | ORAL_CAPSULE | Freq: Two times a day (BID) | ORAL | 0 refills | Status: DC

## 2018-10-22 MED ORDER — BENZONATATE 100 MG PO CAPS: 100.0000 mg | ORAL_CAPSULE | Freq: Three times a day (TID) | ORAL | 0 refills | Status: DC | PRN

## 2018-10-22 NOTE — Progress Notes (Signed)
Virtual Visit via telephone Note Due to COVID-19 pandemic this visit was conducted virtually. This visit type was conducted due to national recommendations for restrictions regarding the COVID-19 Pandemic (e.g. social distancing, sheltering in place) in an effort to limit this patient's exposure and mitigate transmission in our community. All issues noted in this document were discussed and addressed.  A physical exam was not performed with this format.  I connected with Kimberly Mcdowell on 10/22/18 at 11:15 by telephone and verified that I am speaking with the correct person using two identifiers. Kimberly Mcdowell is currently located at home and no one is currently with her during visit. The provider, Mary-Margaret Daphine Deutscher, FNP is located in their office at time of visit.  I discussed the limitations, risks, security and privacy concerns of performing an evaluation and management service by telephone and the availability of in person appointments. I also discussed with the patient that there may be a patient responsible charge related to this service. The patient expressed understanding and agreed to proceed.   History and Present Illness:   Chief Complaint: Bronchitis   HPI Patient calls in c/o cough that started several days ago. Throat is sore , which started last night.  She denies fever. Has not been out of her house and has had no visitors.   Review of Systems  Constitutional: Negative for chills and fever.  HENT: Positive for congestion.   Respiratory: Positive for cough.   Musculoskeletal: Positive for myalgias.  Psychiatric/Behavioral: Negative.   All other systems reviewed and are negative.    Observations/Objective: Alert and oriented- answers all questions appropriately No distress Dry cough during exam Voice sounds froggy   Assessment and Plan: Kimberly Mcdowell in today with chief complaint of Bronchitis   1. Bronchitis Rest No visitors in house and dont go out  of house Meds ordered this encounter  Medications  . cephALEXin (KEFLEX) 500 MG capsule    Sig: Take 1 capsule (500 mg total) by mouth 2 (two) times daily.    Dispense:  14 capsule    Refill:  0    Order Specific Question:   Supervising Provider    Answer:   Arville Care A [id number]  . benzonatate (TESSALON PERLES) 100 MG capsule    Sig: Take 1 capsule (100 mg total) by mouth 3 (three) times daily as needed for cough.    Dispense:  20 capsule    Refill:  0    Order Specific Question:   Supervising Provider    Answer:   Arville Care A [id number]  . predniSONE (DELTASONE) 20 MG tablet    Sig: 2 po at sametime daily for 5 days    Dispense:  10 tablet    Refill:  0    Order Specific Question:   Supervising Provider    Answer:   Arville Care A [[id number]]      Follow Up Instructions: prn    I discussed the assessment and treatment plan with the patient. The patient was provided an opportunity to ask questions and all were answered. The patient agreed with the plan and demonstrated an understanding of the instructions.   The patient was advised to call back or seek an in-person evaluation if the symptoms worsen or if the condition fails to improve as anticipated.  The above assessment and management plan was discussed with the patient. The patient verbalized understanding of and has agreed to the management plan. Patient is aware to call the clinic  if symptoms persist or worsen. Patient is aware when to return to the clinic for a follow-up visit. Patient educated on when it is appropriate to go to the emergency department.   Time call ended:  11:25  I provided 10 minutes of non-face-to-face time during this encounter.    Mary-Margaret Hassell Done, FNP

## 2018-11-27 ENCOUNTER — Telehealth: Payer: Self-pay | Admitting: Nurse Practitioner

## 2018-11-27 NOTE — Telephone Encounter (Signed)
Called patient per mmm note from last med rck visit patient is due for labs. NA and did not have mail box. When patient calls back she needs to schedule visit with MMM.

## 2018-12-12 ENCOUNTER — Other Ambulatory Visit: Payer: Self-pay | Admitting: *Deleted

## 2018-12-12 DIAGNOSIS — R441 Visual hallucinations: Secondary | ICD-10-CM

## 2018-12-12 MED ORDER — GABAPENTIN 100 MG PO CAPS: 100.0000 mg | ORAL_CAPSULE | Freq: Every day | ORAL | 0 refills | Status: DC

## 2019-01-30 ENCOUNTER — Telehealth: Payer: Self-pay | Admitting: Nurse Practitioner

## 2019-01-30 NOTE — Telephone Encounter (Signed)
Spoke with pt and she says you told her friend Ivin Booty the other day to have pt call regarding a fall she had last week. Pt states she is doing fine and that she bought her a walker and is using it now and her family has been coming over and dressing a cut on her knee from the fall.

## 2019-02-10 ENCOUNTER — Other Ambulatory Visit: Payer: Self-pay | Admitting: Nurse Practitioner

## 2019-02-10 DIAGNOSIS — F411 Generalized anxiety disorder: Secondary | ICD-10-CM

## 2019-02-17 ENCOUNTER — Telehealth: Payer: Self-pay | Admitting: Nurse Practitioner

## 2019-02-17 NOTE — Telephone Encounter (Signed)
Appt made for patient and notified

## 2019-02-18 ENCOUNTER — Other Ambulatory Visit: Payer: Self-pay

## 2019-02-19 ENCOUNTER — Encounter: Payer: Self-pay | Admitting: Nurse Practitioner

## 2019-02-19 ENCOUNTER — Ambulatory Visit (INDEPENDENT_AMBULATORY_CARE_PROVIDER_SITE_OTHER): Payer: Medicare HMO | Admitting: Nurse Practitioner

## 2019-02-19 VITALS — BP 144/88 | HR 62 | Temp 97.3°F | Resp 20 | Ht 64.0 in | Wt 220.0 lb

## 2019-02-19 DIAGNOSIS — E119 Type 2 diabetes mellitus without complications: Secondary | ICD-10-CM

## 2019-02-19 DIAGNOSIS — R441 Visual hallucinations: Secondary | ICD-10-CM | POA: Diagnosis not present

## 2019-02-19 DIAGNOSIS — R609 Edema, unspecified: Secondary | ICD-10-CM

## 2019-02-19 DIAGNOSIS — F411 Generalized anxiety disorder: Secondary | ICD-10-CM

## 2019-02-19 DIAGNOSIS — I10 Essential (primary) hypertension: Secondary | ICD-10-CM

## 2019-02-19 DIAGNOSIS — M25461 Effusion, right knee: Secondary | ICD-10-CM

## 2019-02-19 DIAGNOSIS — F3342 Major depressive disorder, recurrent, in full remission: Secondary | ICD-10-CM

## 2019-02-19 DIAGNOSIS — Z794 Long term (current) use of insulin: Secondary | ICD-10-CM | POA: Diagnosis not present

## 2019-02-19 DIAGNOSIS — R69 Illness, unspecified: Secondary | ICD-10-CM | POA: Diagnosis not present

## 2019-02-19 DIAGNOSIS — E782 Mixed hyperlipidemia: Secondary | ICD-10-CM | POA: Diagnosis not present

## 2019-02-19 DIAGNOSIS — E1142 Type 2 diabetes mellitus with diabetic polyneuropathy: Secondary | ICD-10-CM

## 2019-02-19 LAB — BAYER DCA HB A1C WAIVED: HB A1C (BAYER DCA - WAIVED): 7.2 % — ABNORMAL HIGH (ref <7.0)

## 2019-02-19 MED ORDER — METFORMIN HCL 1000 MG PO TABS: ORAL_TABLET | ORAL | 1 refills | Status: DC

## 2019-02-19 MED ORDER — METOPROLOL TARTRATE 50 MG PO TABS: 50.0000 mg | ORAL_TABLET | Freq: Two times a day (BID) | ORAL | 1 refills | Status: DC

## 2019-02-19 MED ORDER — FUROSEMIDE 40 MG PO TABS: 40.0000 mg | ORAL_TABLET | Freq: Every day | ORAL | 1 refills | Status: DC

## 2019-02-19 MED ORDER — GABAPENTIN 100 MG PO CAPS: 100.0000 mg | ORAL_CAPSULE | Freq: Every day | ORAL | 1 refills | Status: DC

## 2019-02-19 MED ORDER — ATORVASTATIN CALCIUM 40 MG PO TABS: 40.0000 mg | ORAL_TABLET | Freq: Every day | ORAL | 1 refills | Status: DC

## 2019-02-19 MED ORDER — AMLODIPINE BESYLATE 5 MG PO TABS: 5.0000 mg | ORAL_TABLET | Freq: Every day | ORAL | 1 refills | Status: DC

## 2019-02-19 MED ORDER — CITALOPRAM HYDROBROMIDE 40 MG PO TABS: 40.0000 mg | ORAL_TABLET | Freq: Every day | ORAL | 1 refills | Status: DC

## 2019-02-19 MED ORDER — GLIPIZIDE ER 2.5 MG PO TB24: ORAL_TABLET | ORAL | 1 refills | Status: DC

## 2019-02-19 MED ORDER — ALPRAZOLAM 0.25 MG PO TABS: 0.2500 mg | ORAL_TABLET | Freq: Every day | ORAL | 3 refills | Status: DC

## 2019-02-19 NOTE — Patient Instructions (Signed)
Knee Effusion  Knee effusion means that you have extra fluid in your knee. This can cause pain. Your knee may be more difficult to bend and move. What are the causes? Common causes are:  Arthritis.  Infection.  Injury.  Autoimmune disease. This means that your body's defense system (immune system) mistakenly attacks healthy body tissues. Follow these instructions at home: Medicines  Take medicines only as told by your doctor.  Do not drive or use heavy machinery while taking prescription pain medicine.  If you are taking prescription pain medicine, take actions to help prevent or treat trouble pooping (constipation). Your doctor may recommend that you: ? Drink enough fluid to keep your pee (urine) pale yellow. ? Eat foods that are high in fiber. These include fresh fruits and vegetables, whole grains, and beans. ? Avoid eating fatty or sweet foods. ? Take a medicine for constipation. If you have a brace:  Wear the brace as told by your doctor. Remove it only as told by your doctor.  Loosen the brace if your toes tingle, become numb, or turn cold and blue.  Keep the brace clean.  If the brace is not waterproof: ? Do not let it get wet. ? Cover it with a watertight covering when you take a bath or a shower. Managing pain, stiffness, and swelling   If told, apply ice to the swollen area: ? If you have a removable brace, remove it as told by your doctor. ? Put ice in a plastic bag. ? Place a towel between your skin and the bag. ? Leave the ice on for 20 minutes, 2-3 times per day.  Keep your knee raised (elevated) when you are sitting or lying down. General instructions  Do not use any products that contain nicotine or tobacco, such as cigarettes and e-cigarettes. These can delay healing. If you need help quitting, ask your doctor.  Use crutches as told by your doctor.  Do exercises as told by your doctor.  Rest as told by your doctor.  Keep all follow-up visits as  told by your doctor. This is important. Contact a doctor if you:  Continue to have pain in your knee. Get help right away if you:  Have swelling or redness of your knee that gets worse or does not get better.  Have serious pain in your knee.  Have a fever. Summary  Knee effusion is when you have extra fluid in your knee. This causes pain and swelling and makes it hard to bend and move your knee.  Take medicines only as told by your doctor.  If you have a brace, wear the brace as told by your doctor. This information is not intended to replace advice given to you by your health care provider. Make sure you discuss any questions you have with your health care provider. Document Revised: 01/22/2017 Document Reviewed: 01/21/2017 Elsevier Patient Education  2020 [employer]  

## 2019-02-19 NOTE — Progress Notes (Signed)
Subjective:    Patient ID: Kimberly Mcdowell, female    DOB: 1930-12-19, 86 y.o.   MRN: [id number]   Chief Complaint: medical management of chronic issues   HPI:  1. Essential hypertension No c/o chest pain, sob, or headache. Does not check blood pressure at home. BP Readings from Last 3 Encounters:  02/11/18 139/84  10/16/17 (!) 148/78  09/05/17 (!) 141/75     2. Type 2 diabetes mellitus without complication, with long-term current use of insulin ([name]) Blood sugars are running good. denies any symptoms of low blood sugar. Lab Results  Component Value Date   HGBA1C 7.1 (H) 02/11/2018     3. Diabetic polyneuropathy associated with type 2 diabetes mellitus ([name]) Has constant numbing of bil feet. Makes gait difficult at times  4. Mixed hyperlipidemia Does not watch diet and does very little exercise. Lab Results  Component Value Date   CHOL 206 (H) 02/11/2018   HDL 54 02/11/2018   LDLCALC 122 (H) 02/11/2018   TRIG 151 (H) 02/11/2018   CHOLHDL 3.8 02/11/2018     5. Peripheral edema Has daily edema of bil lower ext  6. GAD (generalized anxiety disorder) Stays stressed. Is a constant worrier  7. Recurrent major depressive disorder, in full remission ([place]) Is doing ok right now. She is on daily dose of celexa  8. Morbid obesity ([place]) Weight is down 8lbs  Wt Readings from Last 3 Encounters:  02/11/18 228 lb (103.4 kg)  09/05/17 227 lb (103 kg)  03/27/17 229 lb 6.4 oz (104.1 kg)   BMI Readings from Last 3 Encounters:  02/11/18 39.14 kg/m  09/05/17 38.96 kg/m  03/27/17 39.38 kg/m       Outpatient Encounter Medications as of 02/19/2019  Medication Sig  . ALPRAZolam (XANAX) 0.25 MG tablet Take 1 tablet (0.25 mg total) by mouth at bedtime.  [name] amLODipine (NORVASC) 5 MG tablet Take 1 tablet (5 mg total) by mouth daily.  [name] atorvastatin (LIPITOR) 40 MG tablet Take 1 tablet (40 mg total) by mouth daily.  . benzonatate (TESSALON PERLES) 100 MG capsule Take 1  capsule (100 mg total) by mouth 3 (three) times daily as needed for cough.  . cephALEXin (KEFLEX) 500 MG capsule Take 1 capsule (500 mg total) by mouth 2 (two) times daily.  . Cholecalciferol (VITAMIN D3) 2000 UNITS TABS Take 2,000 Int'l Units by mouth daily.    . citalopram (CELEXA) 40 MG tablet Take 1 tablet (40 mg total) by mouth daily.  . furosemide (LASIX) 40 MG tablet Take 1 tablet (40 mg total) by mouth daily.  [name] gabapentin (NEURONTIN) 100 MG capsule Take 1 capsule (100 mg total) by mouth at bedtime. (Needs to be seen before next refill)  . glipiZIDE (GLIPIZIDE XL) 2.5 MG 24 hr tablet Take 1 tablet (2.5 mg total) by mouth daily.  [name] glucose blood (ACCU-CHEK SMARTVIEW) test strip Test blood glucose up to three times a day  . metFORMIN (GLUCOPHAGE) 1000 MG tablet Take 1 Tablet by mouth 2 times a day with a meal  . metoprolol tartrate (LOPRESSOR) 50 MG tablet Take 1 tablet (50 mg total) by mouth 2 (two) times daily.  . predniSONE (DELTASONE) 20 MG tablet 2 po at sametime daily for 5 days     Past Surgical History:  Procedure Laterality Date  . ABDOMINAL HYSTERECTOMY    . APPENDECTOMY    . CHOLECYSTECTOMY    . FRACTURE SURGERY      No family history on file.  New complaints: Patient feel 3 weeks ago and hurt her knee and face. She had contusions on face and right knee. She did not break anything. She has not fallen since then  Social history: Lives by herself but family and friends check on her daily.  Controlled substance contract: 02/19/19    Review of Systems  Constitutional: Negative for diaphoresis.  Eyes: Negative for pain.  Respiratory: Negative for shortness of breath.   Cardiovascular: Negative for chest pain, palpitations and leg swelling.  Gastrointestinal: Negative for abdominal pain.  Endocrine: Negative for polydipsia.  Skin: Negative for rash.  Neurological: Negative for dizziness, weakness and headaches.  Hematological: Does not bruise/bleed easily.  All  other systems reviewed and are negative.      Objective:   Physical Exam Vitals and nursing note reviewed.  Constitutional:      General: She is not in acute distress.    Appearance: Normal appearance. She is well-developed.  HENT:     Head: Normocephalic.     Nose: Nose normal.  Eyes:     Pupils: Pupils are equal, round, and reactive to light.  Neck:     Vascular: No carotid bruit or JVD.  Cardiovascular:     Rate and Rhythm: Normal rate and regular rhythm.     Heart sounds: Normal heart sounds.  Pulmonary:     Effort: Pulmonary effort is normal. No respiratory distress.     Breath sounds: Normal breath sounds. No wheezing or rales.  Chest:     Chest wall: No tenderness.  Abdominal:     General: Bowel sounds are normal. There is no distension or abdominal bruit.     Palpations: Abdomen is soft. There is no hepatomegaly, splenomegaly, mass or pulsatile mass.     Tenderness: There is no abdominal tenderness.  Musculoskeletal:        General: Normal range of motion.     Cervical back: Normal range of motion and neck supple.     Comments: FROM of right knee with pain on full extension- moderate effusion   Lymphadenopathy:     Cervical: No cervical adenopathy.  Skin:    General: Skin is warm and dry.  Neurological:     Mental Status: She is alert and oriented to person, place, and time.     Deep Tendon Reflexes: Reflexes are normal and symmetric.  Psychiatric:        Behavior: Behavior normal.        Thought Content: Thought content normal.        Judgment: Judgment normal.    BP (!) 144/88 (BP Location: Right Arm, Cuff Size: Large)   Pulse 62   Temp (!) 97.3 F (36.3 C) (Temporal)   Resp 20   Ht '5\' 4"'  (1.626 m)   Wt 220 lb (99.8 kg)   SpO2 91%   BMI 37.76 kg/m   HGBA1c 7.2%        Assessment & Plan:  Kimberly Mcdowell comes in today with chief complaint of Medical Management of Chronic Issues   Diagnosis and orders addressed:  1. Essential  hypertension Low sodium diet - CMP14+EGFR - metoprolol tartrate (LOPRESSOR) 50 MG tablet; Take 1 tablet (50 mg total) by mouth 2 (two) times daily.  Dispense: 180 tablet; Refill: 1 - amLODipine (NORVASC) 5 MG tablet; Take 1 tablet (5 mg total) by mouth daily.  Dispense: 90 tablet; Refill: 1  2. Type 2 diabetes mellitus without complication, with long-term current use of insulin ([place]) Continue to  watch carbsin diet - Bayer DCA Hb A1c Waived - Microalbumin / creatinine urine ratio - metFORMIN (GLUCOPHAGE) 1000 MG tablet; Take 1 Tablet by mouth 2 times a day with a meal  Dispense: 180 tablet; Refill: 1 - glipiZIDE (GLIPIZIDE XL) 2.5 MG 24 hr tablet; Take 1 tablet (2.5 mg total) by mouth daily.  Dispense: 90 tablet; Refill: 1  3. Diabetic polyneuropathy associated with type 2 diabetes mellitus ([place]) Do not go barefooted  4. Mixed hyperlipidemia Low fat diet - Lipid panel - atorvastatin (LIPITOR) 40 MG tablet; Take 1 tablet (40 mg total) by mouth daily.  Dispense: 90 tablet; Refill: 1  5. Peripheral edema Elevate legs when sitting - furosemide (LASIX) 40 MG tablet; Take 1 tablet (40 mg total) by mouth daily.  Dispense: 90 tablet; Refill: 1  6. GAD (generalized anxiety disorder) Stress management - ALPRAZolam (XANAX) 0.25 MG tablet; Take 1 tablet (0.25 mg total) by mouth at bedtime.  Dispense: 30 tablet; Refill: 3  7. Recurrent major depressive disorder, in full remission ([place]) - citalopram (CELEXA) 40 MG tablet; Take 1 tablet (40 mg total) by mouth daily.  Dispense: 90 tablet; Refill: 1  8. Morbid obesity ([place]) Discussed diet and exercise for person with BMI >25 Will recheck weight in 3-6 months  9. Hallucination, visual - gabapentin (NEURONTIN) 100 MG capsule; Take 1 capsule (100 mg total) by mouth at bedtime. (Needs to be seen before next refill)  Dispense: 90 capsule; Refill: 1  10. Effusion of bursa of right knee Elevate Ice BID RTO in 2 weeks if no better and we will  inject  Labs pending Health Maintenance reviewed Diet and exercise encouraged  Follow up plan: 3 months   [place], FNP

## 2019-02-20 LAB — LIPID PANEL
Chol/HDL Ratio: 3.3 ratio (ref 0.0–4.4)
Cholesterol, Total: 172 mg/dL (ref 100–199)
HDL: 52 mg/dL (ref >39)
LDL Chol Calc (NIH): 96 mg/dL (ref 0–99)
Triglycerides: 139 mg/dL (ref 0–149)
VLDL Cholesterol Cal: 24 mg/dL (ref 5–40)

## 2019-02-20 LAB — CMP14+EGFR
ALT: 12 IU/L (ref 0–32)
AST: 15 IU/L (ref 0–40)
Albumin/Globulin Ratio: 1.8 (ref 1.2–2.2)
Albumin: 4.2 g/dL (ref 3.6–4.6)
Alkaline Phosphatase: 76 IU/L (ref 39–117)
BUN/Creatinine Ratio: 12 (ref 12–28)
BUN: 11 mg/dL (ref 8–27)
Bilirubin Total: 0.5 mg/dL (ref 0.0–1.2)
CO2: 24 mmol/L (ref 20–29)
Calcium: 9.3 mg/dL (ref 8.7–10.3)
Chloride: 100 mmol/L (ref 96–106)
Creatinine, Ser: 0.92 mg/dL (ref 0.57–1.00)
GFR calc Af Amer: 65 mL/min/{1.73_m2} (ref >59)
GFR calc non Af Amer: 57 mL/min/{1.73_m2} — ABNORMAL LOW (ref >59)
Globulin, Total: 2.4 g/dL (ref 1.5–4.5)
Glucose: 163 mg/dL — ABNORMAL HIGH (ref 65–99)
Potassium: 4.6 mmol/L (ref 3.5–5.2)
Sodium: 139 mmol/L (ref 134–144)
Total Protein: 6.6 g/dL (ref 6.0–8.5)

## 2019-02-20 LAB — MICROALBUMIN / CREATININE URINE RATIO
Creatinine, Urine: 87.1 mg/dL
Microalb/Creat Ratio: 112 mg/g creat — ABNORMAL HIGH (ref 0–29)
Microalbumin, Urine: 97.4 ug/mL

## 2019-02-24 MED ORDER — LISINOPRIL 20 MG PO TABS: 20.0000 mg | ORAL_TABLET | Freq: Every day | ORAL | 1 refills | Status: DC

## 2019-02-24 NOTE — Addendum Note (Signed)
Addended by: Bennie Pierini on: 02/24/2019 01:30 PM   Modules accepted: Orders

## 2019-03-04 ENCOUNTER — Ambulatory Visit: Payer: Medicare HMO | Admitting: Nurse Practitioner

## 2019-04-02 DIAGNOSIS — H401132 Primary open-angle glaucoma, bilateral, moderate stage: Secondary | ICD-10-CM | POA: Diagnosis not present

## 2019-04-02 DIAGNOSIS — Z961 Presence of intraocular lens: Secondary | ICD-10-CM | POA: Diagnosis not present

## 2019-04-02 DIAGNOSIS — H04123 Dry eye syndrome of bilateral lacrimal glands: Secondary | ICD-10-CM | POA: Diagnosis not present

## 2019-04-04 DIAGNOSIS — Z0100 Encounter for examination of eyes and vision without abnormal findings: Secondary | ICD-10-CM | POA: Diagnosis not present

## 2019-05-14 DIAGNOSIS — H401130 Primary open-angle glaucoma, bilateral, stage unspecified: Secondary | ICD-10-CM | POA: Diagnosis not present

## 2019-05-14 DIAGNOSIS — Z961 Presence of intraocular lens: Secondary | ICD-10-CM | POA: Diagnosis not present

## 2019-05-14 DIAGNOSIS — H04123 Dry eye syndrome of bilateral lacrimal glands: Secondary | ICD-10-CM | POA: Diagnosis not present

## 2019-05-20 ENCOUNTER — Ambulatory Visit: Payer: Self-pay | Admitting: Nurse Practitioner

## 2019-05-20 ENCOUNTER — Other Ambulatory Visit: Payer: Self-pay

## 2019-05-20 ENCOUNTER — Ambulatory Visit (INDEPENDENT_AMBULATORY_CARE_PROVIDER_SITE_OTHER): Payer: Medicare HMO | Admitting: *Deleted

## 2019-05-20 DIAGNOSIS — Z0000 Encounter for general adult medical examination without abnormal findings: Secondary | ICD-10-CM

## 2019-05-20 NOTE — Progress Notes (Signed)
MEDICARE ANNUAL WELLNESS VISIT  05/20/2019  Telephone Visit Disclaimer This Medicare AWV was conducted by telephone due to national recommendations for restrictions regarding the COVID-19 Pandemic (e.g. social distancing).  I verified, using two identifiers, that I am speaking with Kimberly Mcdowell or their authorized healthcare agent. I discussed the limitations, risks, security, and privacy concerns of performing an evaluation and management service by telephone and the potential availability of an in-person appointment in the future. The patient expressed understanding and agreed to proceed.   Subjective:  Kimberly Mcdowell is a 86 y.o. female patient of Chevis Pretty, [place] who had a Medicare Annual Wellness Visit today via telephone. Shantil is Retired and lives alone. she has 2 children. she reports that she is socially active and does interact with friends/family regularly. she is not physically active and enjoys shopping and getting together with friends.  Patient Care Team: Chevis Pretty, FNP as PCP - General (Nurse Practitioner)  Advanced Directives 05/20/2019 01/28/2014  Does Patient Have a Medical Advance Directive? No Yes  Copy of [place] in Chart? - No - copy requested  Would patient like information on creating a medical advance directive? Yes (MAU/Ambulatory/Procedural Areas - Information given) -    Hospital Utilization Over the Past 12 Months: # of hospitalizations or ER visits: 0 # of surgeries: 0  Review of Systems    Patient reports that her overall health is unchanged compared to last year.  History obtained from chart review and the patient  Patient Reported Readings (BP, Pulse, CBG, Weight, etc) none  Pain Assessment Pain : No/denies pain     Current Medications & Allergies (verified) Allergies as of 05/20/2019      Reactions   Ezetimibe-simvastatin Other (See Comments)   Muscle aches   Lipitor [atorvastatin Calcium]  Other (See Comments)   Muscle aches      Medication List       Accurate as of May 20, 2019  9:25 AM. If you have any questions, ask your nurse or doctor.        ALPRAZolam 0.25 MG tablet Commonly known as: XANAX Take 1 tablet (0.25 mg total) by mouth at bedtime.   amLODipine 5 MG tablet Commonly known as: NORVASC Take 1 tablet (5 mg total) by mouth daily.   atorvastatin 40 MG tablet Commonly known as: LIPITOR Take 1 tablet (40 mg total) by mouth daily.   citalopram 40 MG tablet Commonly known as: CELEXA Take 1 tablet (40 mg total) by mouth daily.   furosemide 40 MG tablet Commonly known as: LASIX Take 1 tablet (40 mg total) by mouth daily.   gabapentin 100 MG capsule Commonly known as: NEURONTIN Take 1 capsule (100 mg total) by mouth at bedtime. (Needs to be seen before next refill)   glipiZIDE 2.5 MG 24 hr tablet Commonly known as: glipiZIDE XL Take 1 tablet (2.5 mg total) by mouth daily.   glucose blood test strip Commonly known as: Accu-Chek SmartView Test blood glucose up to three times a day   lisinopril 20 MG tablet Commonly known as: ZESTRIL Take 1 tablet (20 mg total) by mouth daily.   metFORMIN 1000 MG tablet Commonly known as: GLUCOPHAGE Take 1 Tablet by mouth 2 times a day with a meal   metoprolol tartrate 50 MG tablet Commonly known as: LOPRESSOR Take 1 tablet (50 mg total) by mouth 2 (two) times daily.   Vitamin D3 50 MCG (2000 UT) Tabs Take 2,000 Int'l Units by  mouth daily.       History (reviewed): Past Medical History:  Diagnosis Date  . Arthritis   . Diabetes mellitus without complication ([name])   . Diabetic neuropathy ([name])   . Hyperlipidemia   . Hypertension   . Hypokalemia   . Osteoporosis    Past Surgical History:  Procedure Laterality Date  . ABDOMINAL HYSTERECTOMY    . APPENDECTOMY    . CHOLECYSTECTOMY    . FRACTURE SURGERY     Family History  Problem Relation Age of Onset  . Stroke Mother   . Arthritis Father     . Arthritis Sister   . Ovarian cancer Sister   . Cancer Sister    Social History   Socioeconomic History  . Marital status: Widowed    Spouse name: Not on file  . Number of children: 2  . Years of education: 38  . Highest education level: Not on file  Occupational History  . Not on file  Tobacco Use  . Smoking status: Former Smoker    Packs/day: 3.00    Years: 15.00    Pack years: 45.00    Types: Cigarettes  . Smokeless tobacco: Never Used  Substance and Sexual Activity  . Alcohol use: No  . Drug use: No  . Sexual activity: Not Currently  Other Topics Concern  . Not on file  Social History Narrative  . Not on file   Social Determinants of Health   Financial Resource Strain:   . Difficulty of Paying Living Expenses:   Food Insecurity:   . Worried About Programme researcher, broadcasting/film/video in the Last Year:   . Barista in the Last Year:   Transportation Needs:   . Freight forwarder (Medical):   [name] Lack of Transportation (Non-Medical):   Physical Activity:   . Days of Exercise per Week:   . Minutes of Exercise per Session:   Stress:   . Feeling of Stress :   Social Connections:   . Frequency of Communication with Friends and Family:   . Frequency of Social Gatherings with Friends and Family:   . Attends Religious Services:   . Active Member of Clubs or Organizations:   . Attends Banker Meetings:   [name] Marital Status:     Activities of Daily Living In your present state of health, do you have any difficulty performing the following activities: 05/20/2019  Hearing? N  Vision? Y  Comment advanced glaucoma  Difficulty concentrating or making decisions? N  Walking or climbing stairs? Y  Dressing or bathing? N  Doing errands, shopping? Y  Comment doesn't drive, hard to read all due to eyes  Preparing Food and eating ? N  Using the Toilet? N  In the past six months, have you accidently leaked urine? N  Do you have problems with loss of bowel control? N   Managing your Medications? N  Housekeeping or managing your Housekeeping? Y  Comment has help every friday  Some recent data might be hidden    Patient Education/ Literacy How often do you need to have someone help you when you read instructions, pamphlets, or other written materials from your doctor or pharmacy?: 1 - Never What is the last grade level you completed in school?: 12  Exercise Current Exercise Habits: The patient does not participate in regular exercise at present, Exercise limited by: None identified  Diet Patient reports consuming 2 meals a day and 3 snack(s) a day Patient reports  that her primary diet is: Diabetic Patient reports that she does have regular access to food.   Depression Screen PHQ 2/9 Scores 05/20/2019 02/19/2019 06/08/2018 05/23/2018 02/11/2018 09/05/2017 03/27/2017  PHQ - 2 Score 1 0 0 0 0 0 0  PHQ- 9 Score - - - - - - -     Fall Risk Fall Risk  05/20/2019 02/19/2019 02/11/2018 09/05/2017 03/27/2017  Falls in the past year? 1 1 1  No Yes  Number falls in past yr: 0 1 1 - 2 or more  Injury with Fall? 1 0 1 - No  Comment - - hand pain, cuts and bruises - -  Risk for fall due to : History of fall(s);Impaired mobility - - - -  Risk for fall due to: Comment - - - - -  Follow up Falls prevention discussed - - - -     Objective:  Kimberly Mcdowell seemed alert and oriented and she participated appropriately during our telephone visit.  Blood Pressure Weight BMI  BP Readings from Last 3 Encounters:  02/19/19 (!) 144/88  02/11/18 139/84  10/16/17 (!) 148/78   Wt Readings from Last 3 Encounters:  02/19/19 220 lb (99.8 kg)  02/11/18 228 lb (103.4 kg)  09/05/17 227 lb (103 kg)   BMI Readings from Last 1 Encounters:  02/19/19 37.76 kg/m    *Unable to obtain current vital signs, weight, and BMI due to telephone visit type  Hearing/Vision  . Charlette did not seem to have difficulty with hearing/understanding during the telephone conversation . Reports that  she has had a formal eye exam by an eye care professional within the past year . Reports that she has not had a formal hearing evaluation within the past year *Unable to fully assess hearing and vision during telephone visit type  Cognitive Function: 6CIT Screen 05/20/2019  What Year? 4 points  What month? 3 points  What time? 0 points  Count back from 20 2 points  Months in reverse 2 points  Repeat phrase 0 points  Total Score 11   (Normal:0-7, Significant for Dysfunction: >8)  Normal Cognitive Function Screening: No: Pt states is just that I woke her up and her mind hasn't had time to think.   Immunization & Health Maintenance Record Immunization History  Administered Date(s) Administered  . Influenza, High Dose Seasonal PF 11/09/2016  . Influenza,inj,Quad PF,6+ Mos 01/28/2014  . Pneumococcal Conjugate-13 04/29/2014  . Pneumococcal Polysaccharide-23 09/23/2010  . Tdap 09/23/2010    Health Maintenance  Topic Date Due  . COVID-19 Vaccine (1) Never done  . OPHTHALMOLOGY EXAM  06/24/2013  . FOOT EXAM  05/23/2019  . HEMOGLOBIN A1C  08/19/2019  . INFLUENZA VACCINE  08/24/2019  . TETANUS/TDAP  09/22/2020  . DEXA SCAN  Completed  . PNA vac Low Risk Adult  Completed       Assessment  This is a routine wellness examination for 09/24/2020.  Health Maintenance: Due or Overdue Health Maintenance Due  Topic Date Due  . COVID-19 Vaccine (1) Never done  . OPHTHALMOLOGY EXAM  06/24/2013    Sharlena 08/24/2013 does not need a referral for Community Assistance: Care Management:   no Social Work:    no Prescription Assistance:  no Nutrition/Diabetes Education:  no   Plan:  Personalized Goals Goals Addressed            This Visit's Progress   . HEMOGLOBIN A1C < 7      . Prevent falls  Personalized Health Maintenance & Screening Recommendations  Advanced directives: has NO advanced directive  - add't info requested. Referral to SW: no  Lung Cancer Screening  Recommended: no (Low Dose CT Chest recommended if Age 60-80 years, 30 pack-year currently smoking OR have quit w/in past 15 years) Hepatitis C Screening recommended: no HIV Screening recommended: no  Advanced Directives: Written information was not prepared per patient's request.  Referrals & Orders No orders of the defined types were placed in this encounter.   Follow-up Plan . Follow-up with Bennie Pierini, FNP as planned on 05/29/2019. [name] Pt lives alone and is still independent with ADL's inside the home.She must be assisted outside of the home due to advanced glaucoma. . Pt just had an eye exam with Dr. Dione Booze. Records requested today . Pt did not do well on 6CIT, she says its because I woke her up and she hadn't had time to think . Pt does not have Advanced Directives at this time, but says that her youngest son is working on it. . No difficulty hearing. . All other health maintenance is up to date. AVS printed and mailed.    I have personally reviewed and noted the following in the patient's chart:   . Medical and social history . Use of alcohol, tobacco or illicit drugs  . Current medications and supplements . Functional ability and status . Nutritional status . Physical activity . Advanced directives . List of other physicians . Hospitalizations, surgeries, and ER visits in previous 12 months . Vitals . Screenings to include cognitive, depression, and falls . Referrals and appointments  In addition, I have reviewed and discussed with [name] certain preventive protocols, quality metrics, and best practice recommendations. A written personalized care plan for preventive services as well as general preventive health recommendations is available and can be mailed to the patient at her request.      Conard Novak, LPN  [id number]

## 2019-05-29 ENCOUNTER — Other Ambulatory Visit: Payer: Self-pay

## 2019-05-29 ENCOUNTER — Ambulatory Visit (INDEPENDENT_AMBULATORY_CARE_PROVIDER_SITE_OTHER): Payer: Medicare HMO | Admitting: Nurse Practitioner

## 2019-05-29 ENCOUNTER — Encounter: Payer: Self-pay | Admitting: Nurse Practitioner

## 2019-05-29 VITALS — BP 187/92 | HR 71 | Temp 97.8°F | Ht 64.0 in | Wt 222.4 lb

## 2019-05-29 DIAGNOSIS — E119 Type 2 diabetes mellitus without complications: Secondary | ICD-10-CM | POA: Diagnosis not present

## 2019-05-29 DIAGNOSIS — E875 Hyperkalemia: Secondary | ICD-10-CM | POA: Diagnosis not present

## 2019-05-29 DIAGNOSIS — R441 Visual hallucinations: Secondary | ICD-10-CM | POA: Diagnosis not present

## 2019-05-29 DIAGNOSIS — R69 Illness, unspecified: Secondary | ICD-10-CM | POA: Diagnosis not present

## 2019-05-29 DIAGNOSIS — I10 Essential (primary) hypertension: Secondary | ICD-10-CM

## 2019-05-29 DIAGNOSIS — F3342 Major depressive disorder, recurrent, in full remission: Secondary | ICD-10-CM

## 2019-05-29 DIAGNOSIS — Z794 Long term (current) use of insulin: Secondary | ICD-10-CM | POA: Diagnosis not present

## 2019-05-29 DIAGNOSIS — R609 Edema, unspecified: Secondary | ICD-10-CM | POA: Diagnosis not present

## 2019-05-29 DIAGNOSIS — R600 Localized edema: Secondary | ICD-10-CM

## 2019-05-29 DIAGNOSIS — E782 Mixed hyperlipidemia: Secondary | ICD-10-CM

## 2019-05-29 DIAGNOSIS — E1142 Type 2 diabetes mellitus with diabetic polyneuropathy: Secondary | ICD-10-CM

## 2019-05-29 DIAGNOSIS — F411 Generalized anxiety disorder: Secondary | ICD-10-CM

## 2019-05-29 LAB — BAYER DCA HB A1C WAIVED: HB A1C (BAYER DCA - WAIVED): 8.4 % — ABNORMAL HIGH (ref <7.0)

## 2019-05-29 MED ORDER — ATORVASTATIN CALCIUM 40 MG PO TABS: 40.0000 mg | ORAL_TABLET | Freq: Every day | ORAL | 1 refills | Status: DC

## 2019-05-29 MED ORDER — METFORMIN HCL 1000 MG PO TABS: ORAL_TABLET | ORAL | 1 refills | Status: DC

## 2019-05-29 MED ORDER — FUROSEMIDE 40 MG PO TABS: 40.0000 mg | ORAL_TABLET | Freq: Every day | ORAL | 1 refills | Status: DC

## 2019-05-29 MED ORDER — ALPRAZOLAM 0.25 MG PO TABS: 0.2500 mg | ORAL_TABLET | Freq: Every day | ORAL | 3 refills | Status: DC

## 2019-05-29 MED ORDER — GLIPIZIDE ER 2.5 MG PO TB24: ORAL_TABLET | ORAL | 1 refills | Status: DC

## 2019-05-29 MED ORDER — AMLODIPINE BESYLATE 5 MG PO TABS: 5.0000 mg | ORAL_TABLET | Freq: Every day | ORAL | 1 refills | Status: DC

## 2019-05-29 MED ORDER — CITALOPRAM HYDROBROMIDE 40 MG PO TABS: 40.0000 mg | ORAL_TABLET | Freq: Every day | ORAL | 1 refills | Status: DC

## 2019-05-29 MED ORDER — GABAPENTIN 100 MG PO CAPS: 100.0000 mg | ORAL_CAPSULE | Freq: Every day | ORAL | 1 refills | Status: DC

## 2019-05-29 MED ORDER — METOPROLOL TARTRATE 50 MG PO TABS: 50.0000 mg | ORAL_TABLET | Freq: Two times a day (BID) | ORAL | 1 refills | Status: DC

## 2019-05-29 MED ORDER — LISINOPRIL 20 MG PO TABS: 20.0000 mg | ORAL_TABLET | Freq: Every day | ORAL | 1 refills | Status: DC

## 2019-05-29 NOTE — Patient Instructions (Signed)
Diabetes Mellitus and Foot Care Foot care is an important part of your health, especially when you have diabetes. Diabetes may cause you to have problems because of poor blood flow (circulation) to your feet and legs, which can cause your skin to:  Become thinner and drier.  Break more easily.  Heal more slowly.  Peel and crack. You may also have nerve damage (neuropathy) in your legs and feet, causing decreased feeling in them. This means that you may not notice minor injuries to your feet that could lead to more serious problems. Noticing and addressing any potential problems early is the best way to prevent future foot problems. How to care for your feet Foot hygiene  Wash your feet daily with warm water and mild soap. Do not use hot water. Then, pat your feet and the areas between your toes until they are completely dry. Do not soak your feet as this can dry your skin.  Trim your toenails straight across. Do not dig under them or around the cuticle. File the edges of your nails with an emery board or nail file.  Apply a moisturizing lotion or petroleum jelly to the skin on your feet and to dry, brittle toenails. Use lotion that does not contain alcohol and is unscented. Do not apply lotion between your toes. Shoes and socks  Wear clean socks or stockings every day. Make sure they are not too tight. Do not wear knee-high stockings since they may decrease blood flow to your legs.  Wear shoes that fit properly and have enough cushioning. Always look in your shoes before you put them on to be sure there are no objects inside.  To break in new shoes, wear them for just a few hours a day. This prevents injuries on your feet. Wounds, scrapes, corns, and calluses  Check your feet daily for blisters, cuts, bruises, sores, and redness. If you cannot see the bottom of your feet, use a mirror or ask someone for help.  Do not cut corns or calluses or try to remove them with medicine.  If you  find a minor scrape, cut, or break in the skin on your feet, keep it and the skin around it clean and dry. You may clean these areas with mild soap and water. Do not clean the area with peroxide, alcohol, or iodine.  If you have a wound, scrape, corn, or callus on your foot, look at it several times a day to make sure it is healing and not infected. Check for: ? Redness, swelling, or pain. ? Fluid or blood. ? Warmth. ? Pus or a bad smell. General instructions  Do not cross your legs. This may decrease blood flow to your feet.  Do not use heating pads or hot water bottles on your feet. They may burn your skin. If you have lost feeling in your feet or legs, you may not know this is happening until it is too late.  Protect your feet from hot and cold by wearing shoes, such as at the beach or on hot pavement.  Schedule a complete foot exam at least once a year (annually) or more often if you have foot problems. If you have foot problems, report any cuts, sores, or bruises to your health care provider immediately. Contact a health care provider if:  You have a medical condition that increases your risk of infection and you have any cuts, sores, or bruises on your feet.  You have an injury that is not   healing.  You have redness on your legs or feet.  You feel burning or tingling in your legs or feet.  You have pain or cramps in your legs and feet.  Your legs or feet are numb.  Your feet always feel cold.  You have pain around a toenail. Get help right away if:  You have a wound, scrape, corn, or callus on your foot and: ? You have pain, swelling, or redness that gets worse. ? You have fluid or blood coming from the wound, scrape, corn, or callus. ? Your wound, scrape, corn, or callus feels warm to the touch. ? You have pus or a bad smell coming from the wound, scrape, corn, or callus. ? You have a fever. ? You have a red line going up your leg. Summary  Check your feet every day  for cuts, sores, red spots, swelling, and blisters.  Moisturize feet and legs daily.  Wear shoes that fit properly and have enough cushioning.  If you have foot problems, report any cuts, sores, or bruises to your health care provider immediately.  Schedule a complete foot exam at least once a year (annually) or more often if you have foot problems. This information is not intended to replace advice given to you by your health care provider. Make sure you discuss any questions you have with your health care provider. Document Revised: 10/02/2018 Document Reviewed: 02/11/2016 Elsevier Patient Education  2020 [employer]  

## 2019-05-29 NOTE — Progress Notes (Signed)
Subjective:    Patient ID: Kimberly Mcdowell, female    DOB: [birth year], 86 y.o.   MRN: [id number]   Chief Complaint: Medical Management of Chronic Issues    HPI:  1. Type 2 diabetes mellitus without complication, with long-term current use of insulin ([name]) Lab Results  Component Value Date   HGBA1C 7.2 (H) 02/19/2019   Patent admits to compliance with diabetes medications but has been eating more sweets recently.  2. Essential hypertension BP Readings from Last 3 Encounters:  05/29/19 (!) 187/92  02/19/19 (!) 144/88  02/11/18 139/84   Patient states that she doesn't check her blood pressure at home. Denies swelling, syncope, or headaches. States that she has vertigo that causes occasional dizziness.  3. Diabetic polyneuropathy associated with type 2 diabetes mellitus ([place]) Patient admits to having numbness and tingling of her legs and feet.  4. Peripheral edema No leg swelling today.  5. Mixed hyperlipidemia Lab Results  Component Value Date   CHOL 172 02/19/2019   HDL 52 02/19/2019   LDLCALC 96 02/19/2019   TRIG 139 02/19/2019   CHOLHDL 3.3 02/19/2019  Patient admits to consuming more sweets in the past 3 months.   6. GAD (generalized anxiety disorder) Patient states that she is a constant worrier over the health of family members.   7. Recurrent major depressive disorder, in full remission [facility]) Depression screen [facility] 2/9 05/20/2019 02/19/2019 06/08/2018 05/23/2018 02/11/2018  Decreased Interest 0 0 0 0 0  Down, Depressed, Hopeless 1 0 0 0 0  PHQ - 2 Score 1 0 0 0 0  Altered sleeping - - - - -  Tired, decreased energy - - - - -  Change in appetite - - - - -  Feeling bad or failure about yourself  - - - - -  Trouble concentrating - - - - -  Moving slowly or fidgety/restless - - - - -  Suicidal thoughts - - - - -  PHQ-9 Score - - - - -  Difficult doing work/chores - - - - -     8. Morbid obesity ([place]) BMI Readings from Last 3 Encounters:  05/29/19 38.17  kg/m  02/19/19 37.76 kg/m  02/11/18 39.14 kg/m    Wt Readings from Last 3 Encounters:  05/29/19 222 lb 6.4 oz (100.9 kg)  02/19/19 220 lb (99.8 kg)  02/11/18 228 lb (103.4 kg)    New complaints: Patient has no new complaints today.   Social history: Patient states she has no more social complaints than usual today.   Controlled substance contract:     Review of Systems  All other systems reviewed and are negative.      Objective:   Physical Exam Constitutional:      Appearance: Normal appearance. She is obese.  HENT:     Head: Normocephalic.  Eyes:     Conjunctiva/sclera: Conjunctivae normal.  Cardiovascular:     Rate and Rhythm: Normal rate and regular rhythm.     Pulses: Normal pulses.     Heart sounds: Normal heart sounds.  Pulmonary:     Breath sounds: Normal breath sounds.  Abdominal:     General: Bowel sounds are normal.  Musculoskeletal:     Cervical back: Neck supple.  Skin:    General: Skin is warm and dry.  Neurological:     Mental Status: She is oriented to person, place, and time.  Psychiatric:        Mood and Affect: Mood normal.  Behavior: Behavior normal.   BP on recheck 160/80. Patient states she hasn't taken her blood pressure medication for the day yet.   Hgb A1c-8.4     Assessment & Plan:  Kimberly Mcdowell comes in today with chief complaint of Medical Management of Chronic Issues   Diagnosis and orders addressed:  1. Type 2 diabetes mellitus without complication, with long-term current use of insulin ([name])_0 Patient did not want to change med despite increase in HGBA1c- he will watch diet. - Bayer DCA Hb A1c Waived  2. Essential hypertension - CBC with Differential/Platelet - CMP14+EGFR - Lipid panel  3. Diabetic polyneuropathy associated with type 2 diabetes mellitus ([name]) Continue Gabapentin. Monitor for signs and symptoms of decreased circulation.  4. Peripheral edema Continue to monitor for swelling.  5.  Mixed hyperlipidemia Heart healthy diet encouraged.  6. GAD (generalized anxiety disorder) Stress management  7. Recurrent major depressive disorder, in full remission ([place])   8. Morbid obesity ([place]) Discussed diet and exercise for person with BMI >25 Will recheck weight in 3-6 months  Meds ordered this encounter  Medications  . lisinopril (ZESTRIL) 20 MG tablet    Sig: Take 1 tablet (20 mg total) by mouth daily.    Dispense:  90 tablet    Refill:  1    Order Specific Question:   Supervising Provider    Answer:   Caryl Pina A [id number]  . metoprolol tartrate (LOPRESSOR) 50 MG tablet    Sig: Take 1 tablet (50 mg total) by mouth 2 (two) times daily.    Dispense:  180 tablet    Refill:  1    Order Specific Question:   Supervising Provider    Answer:   Caryl Pina A [id number]  . amLODipine (NORVASC) 5 MG tablet    Sig: Take 1 tablet (5 mg total) by mouth daily.    Dispense:  90 tablet    Refill:  1    Order Specific Question:   Supervising Provider    Answer:   Caryl Pina A [id number]  . furosemide (LASIX) 40 MG tablet    Sig: Take 1 tablet (40 mg total) by mouth daily.    Dispense:  90 tablet    Refill:  1    Order Specific Question:   Supervising Provider    Answer:   Caryl Pina A [id number]  . citalopram (CELEXA) 40 MG tablet    Sig: Take 1 tablet (40 mg total) by mouth daily.    Dispense:  90 tablet    Refill:  1    Order Specific Question:   Supervising Provider    Answer:   Caryl Pina A [id number]  . atorvastatin (LIPITOR) 40 MG tablet    Sig: Take 1 tablet (40 mg total) by mouth daily.    Dispense:  90 tablet    Refill:  1    Order Specific Question:   Supervising Provider    Answer:   Caryl Pina A [id number]  . gabapentin (NEURONTIN) 100 MG capsule    Sig: Take 1 capsule (100 mg total) by mouth at bedtime. (Needs to be seen before next refill)    Dispense:  90 capsule    Refill:  1    Last note in May said stay at 1 QHS  d/t hallucinations    Order Specific Question:   Supervising Provider    Answer:   Caryl Pina A [[id number]]  . metFORMIN (GLUCOPHAGE) 1000 MG tablet    Sig: Take 1  Tablet by mouth 2 times a day with a meal    Dispense:  180 tablet    Refill:  1    Order Specific Question:   Supervising Provider    Answer:   Caryl Pina A [id number]  . glipiZIDE (GLIPIZIDE XL) 2.5 MG 24 hr tablet    Sig: Take 1 tablet (2.5 mg total) by mouth daily.    Dispense:  90 tablet    Refill:  1    Order Specific Question:   Supervising Provider    Answer:   Caryl Pina A [id number]  . ALPRAZolam (XANAX) 0.25 MG tablet    Sig: Take 1 tablet (0.25 mg total) by mouth at bedtime.    Dispense:  30 tablet    Refill:  3    Order Specific Question:   Supervising Provider    Answer:   Caryl Pina A [id number]    Labs pending Health Maintenance reviewed Diet and exercise encouraged  Follow up plan: Follow up in 3 months or sooner if new complaints arise.   Mary-Margaret Hassell Done, FNP Robynn Pane, RN, FNP student

## 2019-05-30 LAB — CMP14+EGFR
ALT: 14 IU/L (ref 0–32)
AST: 15 IU/L (ref 0–40)
Albumin/Globulin Ratio: 1.7 (ref 1.2–2.2)
Albumin: 4.4 g/dL (ref 3.6–4.6)
Alkaline Phosphatase: 81 IU/L (ref 39–117)
BUN/Creatinine Ratio: 17 (ref 12–28)
BUN: 16 mg/dL (ref 8–27)
Bilirubin Total: 0.4 mg/dL (ref 0.0–1.2)
CO2: 26 mmol/L (ref 20–29)
Calcium: 9.5 mg/dL (ref 8.7–10.3)
Chloride: 102 mmol/L (ref 96–106)
Creatinine, Ser: 0.95 mg/dL (ref 0.57–1.00)
GFR calc Af Amer: 63 mL/min/{1.73_m2} (ref >59)
GFR calc non Af Amer: 54 mL/min/{1.73_m2} — ABNORMAL LOW (ref >59)
Globulin, Total: 2.6 g/dL (ref 1.5–4.5)
Glucose: 142 mg/dL — ABNORMAL HIGH (ref 65–99)
Potassium: 5.6 mmol/L — ABNORMAL HIGH (ref 3.5–5.2)
Sodium: 141 mmol/L (ref 134–144)
Total Protein: 7 g/dL (ref 6.0–8.5)

## 2019-05-30 LAB — CBC WITH DIFFERENTIAL/PLATELET
Basophils Absolute: 0 10*3/uL (ref 0.0–0.2)
Basos: 0 %
EOS (ABSOLUTE): 0.1 10*3/uL (ref 0.0–0.4)
Eos: 2 %
Hematocrit: 41.1 % (ref 34.0–46.6)
Hemoglobin: 13.3 g/dL (ref 11.1–15.9)
Immature Grans (Abs): 0 10*3/uL (ref 0.0–0.1)
Immature Granulocytes: 0 %
Lymphocytes Absolute: 2.8 10*3/uL (ref 0.7–3.1)
Lymphs: 36 %
MCH: 29.2 pg (ref 26.6–33.0)
MCHC: 32.4 g/dL (ref 31.5–35.7)
MCV: 90 fL (ref 79–97)
Monocytes Absolute: 0.5 10*3/uL (ref 0.1–0.9)
Monocytes: 7 %
Neutrophils Absolute: 4.3 10*3/uL (ref 1.4–7.0)
Neutrophils: 55 %
Platelets: 219 10*3/uL (ref 150–450)
RBC: 4.56 x10E6/uL (ref 3.77–5.28)
RDW: 13 % (ref 11.7–15.4)
WBC: 7.8 10*3/uL (ref 3.4–10.8)

## 2019-05-30 LAB — LIPID PANEL
Chol/HDL Ratio: 4.2 ratio (ref 0.0–4.4)
Cholesterol, Total: 204 mg/dL — ABNORMAL HIGH (ref 100–199)
HDL: 49 mg/dL (ref >39)
LDL Chol Calc (NIH): 134 mg/dL — ABNORMAL HIGH (ref 0–99)
Triglycerides: 115 mg/dL (ref 0–149)
VLDL Cholesterol Cal: 21 mg/dL (ref 5–40)

## 2019-06-02 NOTE — Addendum Note (Signed)
Addended by: Adella Hare B on: 06/02/2019 10:55 AM   Modules accepted: Orders

## 2019-06-19 ENCOUNTER — Other Ambulatory Visit: Payer: Self-pay | Admitting: Nurse Practitioner

## 2019-08-18 DIAGNOSIS — J42 Unspecified chronic bronchitis: Secondary | ICD-10-CM | POA: Diagnosis not present

## 2019-08-18 DIAGNOSIS — I10 Essential (primary) hypertension: Secondary | ICD-10-CM | POA: Diagnosis not present

## 2019-08-18 DIAGNOSIS — E1142 Type 2 diabetes mellitus with diabetic polyneuropathy: Secondary | ICD-10-CM | POA: Diagnosis not present

## 2019-08-18 DIAGNOSIS — G8929 Other chronic pain: Secondary | ICD-10-CM | POA: Diagnosis not present

## 2019-08-18 DIAGNOSIS — K08109 Complete loss of teeth, unspecified cause, unspecified class: Secondary | ICD-10-CM | POA: Diagnosis not present

## 2019-08-18 DIAGNOSIS — E785 Hyperlipidemia, unspecified: Secondary | ICD-10-CM | POA: Diagnosis not present

## 2019-08-18 DIAGNOSIS — R69 Illness, unspecified: Secondary | ICD-10-CM | POA: Diagnosis not present

## 2019-08-18 DIAGNOSIS — Z6841 Body Mass Index (BMI) 40.0 and over, adult: Secondary | ICD-10-CM | POA: Diagnosis not present

## 2019-08-29 ENCOUNTER — Ambulatory Visit: Payer: Self-pay | Admitting: Nurse Practitioner

## 2019-09-24 DIAGNOSIS — H401133 Primary open-angle glaucoma, bilateral, severe stage: Secondary | ICD-10-CM | POA: Diagnosis not present

## 2019-09-24 DIAGNOSIS — Z961 Presence of intraocular lens: Secondary | ICD-10-CM | POA: Diagnosis not present

## 2019-09-24 DIAGNOSIS — H04123 Dry eye syndrome of bilateral lacrimal glands: Secondary | ICD-10-CM | POA: Diagnosis not present

## 2019-11-05 DIAGNOSIS — H401133 Primary open-angle glaucoma, bilateral, severe stage: Secondary | ICD-10-CM | POA: Diagnosis not present

## 2019-11-05 DIAGNOSIS — Z961 Presence of intraocular lens: Secondary | ICD-10-CM | POA: Diagnosis not present

## 2019-11-05 DIAGNOSIS — H04123 Dry eye syndrome of bilateral lacrimal glands: Secondary | ICD-10-CM | POA: Diagnosis not present

## 2019-12-03 DIAGNOSIS — H401133 Primary open-angle glaucoma, bilateral, severe stage: Secondary | ICD-10-CM | POA: Diagnosis not present

## 2019-12-03 DIAGNOSIS — H04123 Dry eye syndrome of bilateral lacrimal glands: Secondary | ICD-10-CM | POA: Diagnosis not present

## 2019-12-03 DIAGNOSIS — Z961 Presence of intraocular lens: Secondary | ICD-10-CM | POA: Diagnosis not present

## 2019-12-31 ENCOUNTER — Telehealth: Payer: Self-pay | Admitting: *Deleted

## 2019-12-31 NOTE — Chronic Care Management (AMB) (Signed)
  Chronic Care Management   Outreach Note  12/31/2019 Name: Kimberly Mcdowell MRN: [id number] DOB: 02-Jun-1930  Kimberly Mcdowell is a 86 y.o. year old female who is a primary care patient of Bennie Pierini, FNP. I reached out to Kimberly Mcdowell by phone today in response to a referral sent by Ms. Tyshauna G Dobberstein's health plan.     An unsuccessful telephone outreach was attempted today. The patient was referred to the case management team for assistance with care management and care coordination.   Follow Up Plan: The care management team will reach out to the patient again over the next 7 days.If patient returns call to provider office, please advise to call Embedded Care Management Care Guide Gwenevere Ghazi at [phone].  Gwenevere Ghazi  Care Guide, Embedded Care Coordination [facility] Management

## 2020-01-05 NOTE — Chronic Care Management (AMB) (Signed)
  Chronic Care Management   Outreach Note  01/05/2020 Name: Kimberly Mcdowell MRN: [id number] DOB: March 17, 1930  Kimberly Mcdowell is a 86 y.o. year old female who is a primary care patient of Bennie Pierini, FNP. I reached out to Kimberly Mcdowell by phone today in response to a referral sent by [name]'s health plan.     A second unsuccessful telephone outreach was attempted today. The patient was referred to the case management team for assistance with care management and care coordination.   Follow Up Plan: The care management team will reach out to the patient again over the next 7 days. If patient returns call to provider office, please advise to call Embedded Care Management Care Guide Gwenevere Ghazi at [phone].  Gwenevere Ghazi  Care Guide, Embedded Care Coordination [facility] Management

## 2020-01-06 NOTE — Chronic Care Management (AMB) (Signed)
  Chronic Care Management   Note  01/06/2020 Name: JINNA WEINMAN MRN: [id number] DOB: [birth year]  [name] is a 86 y.o. year old female who is a primary care patient of Chevis Pretty, [place]. I reached out to [name] by phone today in response to a referral sent by Ms. Roux G Santaella's health plan.     Ms. Lavey was given information about Chronic Care Management services today including:  1. CCM service includes personalized support from designated clinical staff supervised by her physician, including individualized plan of care and coordination with other care providers 2. 24/7 contact phone numbers for assistance for urgent and routine care needs. 3. Service will only be billed when office clinical staff spend 20 minutes or more in a month to coordinate care. 4. Only one practitioner may furnish and bill the service in a calendar month. 5. The patient may stop CCM services at any time (effective at the end of the month) by phone call to the office staff. 6. The patient will be responsible for cost sharing (co-pay) of up to 20% of the service fee (after annual deductible is met).  Patient agreed to services and verbal consent obtained.   Follow up plan: Telephone appointment with care management team member scheduled for:01/28/2020  [place] Management

## 2020-01-08 ENCOUNTER — Telehealth: Payer: Self-pay

## 2020-01-08 NOTE — Telephone Encounter (Signed)
Line busy

## 2020-01-08 NOTE — Telephone Encounter (Signed)
  Prescription Request  01/08/2020  What is the name of the medication or equipment? Gabapentin, Pt is taking 2 tablets a day. She said [facility] pharmacy said she needs new rx called in  Have you contacted your pharmacy to request a refill? (if applicable) yes  Which pharmacy would you like this sent to? [facility]   Patient notified that their request is being sent to the clinical staff for review and that they should receive a response within 2 business days.

## 2020-01-13 NOTE — Telephone Encounter (Signed)
LMOVM to please call the office and make an appointment with Kimberly Mcdowell to discuss change to her Gabapentin her last visit was in May

## 2020-01-28 ENCOUNTER — Ambulatory Visit: Payer: Medicare HMO | Admitting: *Deleted

## 2020-01-28 DIAGNOSIS — Z794 Long term (current) use of insulin: Secondary | ICD-10-CM

## 2020-01-28 DIAGNOSIS — E119 Type 2 diabetes mellitus without complications: Secondary | ICD-10-CM

## 2020-01-28 DIAGNOSIS — I10 Essential (primary) hypertension: Secondary | ICD-10-CM

## 2020-01-28 NOTE — Patient Instructions (Signed)
Visit Information  Goals Addressed            This Visit's Progress   . [place] My Quality of Life       Timeframe:  Long-Range Goal Priority:  High Start Date:                             Expected End Date:                       Follow Up Date 03/02/20    . Remain socially active  . Keep good relationship with friends and family . Visit or talk with friends/family daily . Stay engaged in spiritual practices/church . Talk with counselor Nicki Reaper Forrest, LCSW) regarding stress related to health conditions . Remain physically active as much as possible . Do something that you enjoy daily (baking, cooking, puzzles, etc)   Why is this important?    Having a long-term illness can be scary.   It can also be stressful for you and your caregiver.   These steps may help.    Notes:     [name] Monitor and Manage My Blood Sugar-Diabetes Type 2       Timeframe:  Long-Range Goal Priority:  Medium Start Date:                             Expected End Date:                       Follow Up Date 03/02/20   . Check and record blood sugar 3-5 times a week  . check blood sugar if I feel it is too high or too low . take the blood sugar log to all doctor visits . take the blood sugar meter to all doctor visits  . Take medication as prescribed . Keep routine f/u appointments with PCP . Call PCP if blood sugar readings are outside of recommended range (< 150 fasting with a goal of 100-130)   Why is this important?    Checking your blood sugar at home helps to keep it from getting very high or very low.   Writing the results in a diary or log helps the doctor know how to care for you.   Your blood sugar log should have the time, date and the results.   Also, write down the amount of insulin or other medicine that you take.   Other information, like what you ate, exercise done and how you were feeling, will also be helpful.     Notes:     . Track and Manage My Blood Pressure-Hypertension        Timeframe:  Long-Range Goal Priority:  Medium Start Date:                             Expected End Date:                       Follow Up Date 03/02/20    . check blood pressure 3 times per week and as needed . write blood pressure results in a log or diary  . Bring blood pressure log to medical appointments . Bring blood pressure machine to next visit to check accuracy against office machine . Call PCP if blood pressure readings are  outside of recommended range . Watch sodium in diet . Take medications as prescribed . Keep routine follow-up appointments with PCP   Why is this important?    You won't feel high blood pressure, but it can still hurt your blood vessels.   High blood pressure can cause heart or kidney problems. It can also cause a stroke.   Making lifestyle changes like losing a little weight or eating less salt will help.   Checking your blood pressure at home and at different times of the day can help to control blood pressure.   If the doctor prescribes medicine remember to take it the way the doctor ordered.   Call the office if you cannot afford the medicine or if there are questions about it.     Notes:       Ms. Christenberry was given information about Chronic Care Management services including:  1. CCM service includes personalized support from designated clinical staff supervised by her physician, including individualized plan of care and coordination with other care providers 2. 24/7 contact phone numbers for assistance for urgent and routine care needs. 3. Service will only be billed when office clinical staff spend 20 minutes or more in a month to coordinate care. 4. Only one practitioner may furnish and bill the service in a calendar month. 5. The patient may stop CCM services at any time (effective at the end of the month) by phone call to the office staff. 6. The patient will be responsible for cost sharing (co-pay) of up to 20% of the service fee  (after annual deductible is met).  Patient agreed to services and verbal consent obtained.    The patient verbalized understanding of instructions, educational materials, and care plan provided today and declined offer to receive copy of patient instructions, educational materials, and care plan.   Follow Up Plan:  . Telephone follow up appointment with care management team member scheduled for: 03/02/2020 with RN Care Manager . The patient has been provided with contact information for the care management team and has been advised to call with any health related questions or concerns.  . The patient will call [phone] as advised to schedule a routine follow-up with Chevis Pretty, FNP.   Chong Sicilian, BSN, RN-BC Embedded Chronic Care Manager [facility] [place] Management Direct Dial: [phone]

## 2020-01-28 NOTE — Chronic Care Management (AMB) (Signed)
Chronic Care Management   Initial Visit Note  01/28/2020 Name: Kimberly Mcdowell MRN: [id number] DOB: [birth year]  Referred by: Bennie Pierini, FNP Reason for referral : Chronic Care Management (RN Initial Visit)   Kimberly Mcdowell is a 87 y.o. year old female who is a primary care patient of Bennie Pierini, FNP. The CCM team was consulted for assistance with chronic disease management and care coordination needs related to HTN, diabetes, arthritis, neuropathy, and glaucoma  Review of patient status, including review of consultants reports, relevant laboratory and other test results, and collaboration with appropriate care team members and the patient's provider was performed as part of comprehensive patient evaluation and provision of chronic care management services.    SDOH (Social Determinants of Health) assessments performed: Yes See Care Plan activities for detailed interventions related to SDOH     Medications: Outpatient Encounter Medications as of 01/28/2020  Medication Sig  . cephALEXin (KEFLEX) 500 MG capsule TAKE 1 CAPSULE BY MOUTH 2 TIMES DAILY.  [name] ALPRAZolam (XANAX) 0.25 MG tablet Take 1 tablet (0.25 mg total) by mouth at bedtime.  [name] amLODipine (NORVASC) 5 MG tablet Take 1 tablet (5 mg total) by mouth daily.  [name] atorvastatin (LIPITOR) 40 MG tablet Take 1 tablet (40 mg total) by mouth daily.  . Cholecalciferol (VITAMIN D3) 2000 UNITS TABS Take 2,000 Int'l Units by mouth daily.    . citalopram (CELEXA) 40 MG tablet Take 1 tablet (40 mg total) by mouth daily.  . furosemide (LASIX) 40 MG tablet Take 1 tablet (40 mg total) by mouth daily.  [name] gabapentin (NEURONTIN) 100 MG capsule Take 1 capsule (100 mg total) by mouth at bedtime. (Needs to be seen before next refill)  . glipiZIDE (GLIPIZIDE XL) 2.5 MG 24 hr tablet Take 1 tablet (2.5 mg total) by mouth daily.  [name] glucose blood (ACCU-CHEK SMARTVIEW) test strip Test blood glucose up to three times a day  . lisinopril  (ZESTRIL) 20 MG tablet Take 1 tablet (20 mg total) by mouth daily.  . metFORMIN (GLUCOPHAGE) 1000 MG tablet Take 1 Tablet by mouth 2 times a day with a meal  . metoprolol tartrate (LOPRESSOR) 50 MG tablet Take 1 tablet (50 mg total) by mouth 2 (two) times daily.   No facility-administered encounter medications on file as of 01/28/2020.     Objective:  Lab Results  Component Value Date   HGBA1C 8.4 (H) 05/29/2019   HGBA1C 7.2 (H) 02/19/2019   HGBA1C 7.1 (H) 02/11/2018   Lab Results  Component Value Date   LDLCALC 134 (H) 05/29/2019   CREATININE 0.95 05/29/2019   BP Readings from Last 3 Encounters:  05/29/19 (!) 187/92  02/19/19 (!) 144/88  02/11/18 139/84     Patient Care Plan: RNCM: Diabetes    Problem Identified: Glycemic Management (Diabetes, Type 2)   Priority: Medium    Long-Range Goal: Glycemic Management Optimized   Start Date: 01/28/2020  This Visit's Progress: Not on track  Priority: Medium  Note:   Current Barriers:  . Chronic Disease Management support and education needs related to diabetes . Does not check blood sugar  . Does not adhere to a diabetic diet  Nurse Case Manager Clinical Goal(s):  [name] Over the next 30 days, patient will verbalize understanding of plan for diabetes management . Over the next 30 days, patient will work with Medical illustrator to address needs related to self-management of diabetes . Over the next 30 days, patient will schedule routine follow-up appointment with PCP  Interventions:  . 1:1 collaboration with Bennie Pierini, FNP regarding development and update of comprehensive plan of care as evidenced by provider attestation and co-signature . Inter-disciplinary care team collaboration (see longitudinal plan of care) . Evaluation of current treatment plan related to diabetes and patient's adherence to plan as established by provider. . Chart reviewed including relevant office notes and lab results . Discussed most recent A1C of  8.2 and that it had increased from previous testing . Discussed home blood sugar testing o She does not test at home o She does have a meter . Recommended checking and recording blood sugar at least 3 times a week  . Advised to call PCP with any readings outside of recommended range . Discussed goal of < 150 fasting and ideally between 100-130 . Reviewed and discussed medications o Patient takes medication as directed and does not have any problems with affordability . Discussed diet o Does not follow a particular diet . Encouraged patient to watch portion sizes and to eat more lean proteins and vegetables than simple carbs . Discussed family/social support o Has two sons and a daughter-in-law that are very involved in her care o Has a non-relative caregiver that comes on Fridays to help with household chores o Has support from her church family . Discussed mobility and ability to perform ADLs o Lives alone o Some difficulty with walking due to neuropathy in feet and arthritis o Neuropathy is not well controlled with current dose of gabapentin. Will discuss with PCP  o Does not drive but family provides transportation . Advised to schedule a follow-up visit with PCP o Patient prefers to call and schedule on her own . Provided with RN Care Manager contact number and encouraged to reach out as needed  Patient Goals/Self-Care Activities Over the next 30 days, patient will: . Check and record blood sugar 3-5 times a week  . check blood sugar if I feel it is too high or too low . take the blood sugar log to all doctor visits . take the blood sugar meter to all doctor visits  . Take medication as prescribed . Keep routine f/u appointments with PCP . Call PCP if blood sugar readings are outside of recommended range (< 150 fasting with a goal of 100-130)    Patient Care Plan: RNCM: Quality of Life    Problem Identified: Quality of Life (General Plan of Care)   Priority: High     Long-Range Goal: Quality of Life Maintained   Start Date: 01/28/2020  This Visit's Progress: On track  Priority: High  Note:   Current Barriers:  . Unable to independently drive . Decreased vision due to glaucoma . Pain from arthritis and neuropathy  Nurse Case Manager Clinical Goal(s):  [name] Over the next 180 days, patient will maintain current activity level or increase activity level . Over the next 180 days, patient will remain engaged with church and family for emotional, spiritual, and physical support  Interventions:  . 1:1 collaboration with Bennie Pierini, FNP regarding development and update of comprehensive plan of care as evidenced by provider attestation and co-signature . Inter-disciplinary care team collaboration (see longitudinal plan of care) . Chart reviewed including recent office notes and lab results . Discussed glaucoma and affect of decreased vision on her ability to perform ADLs and activities that she enjoys o She enjoys baking and is still able to bake cakes that she knows the recipes by heart o Has difficulty reading recipes but does wear glasses  and has a magnifier that helps . Discussed family and social support o Family is very involved and she talks or visits with them daily o She is involved in her church and has good relationships with church members . Discussed spirituality and how that has helped her manage the stress of her chronic conditions as well as the stress of family member's illnesses  . Encouraged patient to remain as physically and socially active as possible . Encouraged patient to reach out to Theadore Nan, LCSW for counseling services if interested . Advised to schedule follow-up appointment with PCP o Discuss pain from peripheral neuropathy and possible medication adjustment to better manage pain . Encouraged patient to keep appointments with ophthalmologist  o Unable to improve vision but they are trying to preserve current vision.  Patient has made peace with this.  . Provided with RN Care Manager contact number and encouraged to reach out as needed  Patient Goals/Self-Care Activities Over the next 30 days, patient will: . Remain socially active  . Keep good relationship with friends and family . Visit or talk with friends/family daily . Stay engaged in spiritual practices/church . Talk with counselor Nicki Reaper Forrest, LCSW) regarding stress related to health conditions . Remain physically active as much as possible . Do something that you enjoy daily (baking, cooking, puzzles, etc)     Patient Care Plan: RNCM: Hypertension    Problem Identified: Hypertension (Hypertension)     Long-Range Goal: Hypertension Monitored   Start Date: 01/28/2020  This Visit's Progress: Not on track  Priority: Medium  Note:   Current Barriers:  . Chronic Disease Management support and education needs related to hypertension management . Does not check her blood pressure regularly . Overdue for f/u with PCP  Nurse Case Manager Clinical Goal(s):  [name] Over the next 30 days, patient will verbalize understanding of plan for hypertension management . Over the next 30 days, patient will work with Consulting civil engineer to address needs related to self-management of hypertension . Over the next 30 days, patient will schedule a routine follow-up appointment with PCP  Interventions:  . 1:1 collaboration with Chevis Pretty, FNP regarding development and update of comprehensive plan of care as evidenced by provider attestation and co-signature . Inter-disciplinary care team collaboration (see longitudinal plan of care) . Evaluation of current treatment plan related to hypertension and patient's adherence to plan as established by provider. . Chart reviewed including relevant office notes and lab results . Reviewed and discussed medications . Discussed home blood pressure readings o Patient has a monitor but does not check blood pressure  regularly o Unsure if monitor is accurate . Encouraged patient to check and record blood pressure 3 times a week and as needed . Advised to schedule follow-up appointment with PCP o Bring blood pressure monitor to visit so that it can be checked against the office machine for accuracy o Bring blood pressure log to PCP visit . Call PCP with any readings outside of recommended range . Discussed diet o Does not follow a particular diet o Encouraged to watch sodium in diet . Discussed mobility and activity level o Enjoys baking/cooking o Has some difficulty with ambulation due to neuropathy and arthritis o Decreased vision in L eye o At risk for falls . Discussed family and social support o Involved in church o Strong spiritual faith o Has help from two sons and daughter-in-law as well as a non-relative caregiver . Provided with RN Care Manager contact number and encouraged to  reach out as needed  Patient Goals/Self-Care Activities Over the next 30 days, patient will: . check blood pressure 3 times per week and as needed . write blood pressure results in a log or diary  . Bring blood pressure log to medical appointments . Bring blood pressure machine to next visit to check accuracy against office machine . Call PCP if blood pressure readings are outside of recommended range . Watch sodium in diet . Take medications as prescribed . Keep routine follow-up appointments with PCP       Follow Up Plan:  . Telephone follow up appointment with care management team member scheduled for: 03/02/2020 with RN Care Manager . The patient has been provided with contact information for the care management team and has been advised to call with any health related questions or concerns.  . The patient will call [phone] as advised to schedule a routine follow-up with Bennie Pierini, FNP.   Demetrios Loll, BSN, RN-BC Embedded Chronic Care Manager [facility] Care  Management Direct Dial: [phone]

## 2020-03-02 ENCOUNTER — Ambulatory Visit (INDEPENDENT_AMBULATORY_CARE_PROVIDER_SITE_OTHER): Payer: Medicare HMO | Admitting: *Deleted

## 2020-03-02 DIAGNOSIS — E119 Type 2 diabetes mellitus without complications: Secondary | ICD-10-CM | POA: Diagnosis not present

## 2020-03-02 DIAGNOSIS — Z794 Long term (current) use of insulin: Secondary | ICD-10-CM

## 2020-03-02 DIAGNOSIS — I10 Essential (primary) hypertension: Secondary | ICD-10-CM

## 2020-03-02 NOTE — Chronic Care Management (AMB) (Signed)
Chronic Care Management   CCM RN Visit Note  03/02/2020 Name: Kimberly Mcdowell MRN: [id number] DOB: [birth year]  Subjective: Kimberly Mcdowell is a 87 y.o. year old female who is a primary care patient of Bennie Pierini, FNP. The care management team was consulted for assistance with disease management and care coordination needs.    Engaged with patient by telephone for follow up visit in response to provider referral for case management and/or care coordination services.   Consent to Services:  The patient was given information about Chronic Care Management services, agreed to services, and gave verbal consent prior to initiation of services.  Please see initial visit note for detailed documentation.   Patient agreed to services and verbal consent obtained.   Assessment: Review of patient past medical history, allergies, medications, health status, including review of consultants reports, laboratory and other test data, was performed as part of comprehensive evaluation and provision of chronic care management services.   SDOH (Social Determinants of Health) assessments and interventions performed:    CCM Care Plan  Allergies  Allergen Reactions  . Ezetimibe-Simvastatin Other (See Comments)    Muscle aches  . Lipitor [Atorvastatin Calcium] Other (See Comments)    Muscle aches    Outpatient Encounter Medications as of 03/02/2020  Medication Sig  . cephALEXin (KEFLEX) 500 MG capsule TAKE 1 CAPSULE BY MOUTH 2 TIMES DAILY.  [name] ALPRAZolam (XANAX) 0.25 MG tablet Take 1 tablet (0.25 mg total) by mouth at bedtime.  [name] amLODipine (NORVASC) 5 MG tablet Take 1 tablet (5 mg total) by mouth daily.  [name] atorvastatin (LIPITOR) 40 MG tablet Take 1 tablet (40 mg total) by mouth daily.  . Cholecalciferol (VITAMIN D3) 2000 UNITS TABS Take 2,000 Int'l Units by mouth daily.    . citalopram (CELEXA) 40 MG tablet Take 1 tablet (40 mg total) by mouth daily.  . furosemide (LASIX) 40 MG tablet Take 1  tablet (40 mg total) by mouth daily.  [name] gabapentin (NEURONTIN) 100 MG capsule Take 1 capsule (100 mg total) by mouth at bedtime. (Needs to be seen before next refill)  . glipiZIDE (GLIPIZIDE XL) 2.5 MG 24 hr tablet Take 1 tablet (2.5 mg total) by mouth daily.  [name] glucose blood (ACCU-CHEK SMARTVIEW) test strip Test blood glucose up to three times a day  . lisinopril (ZESTRIL) 20 MG tablet Take 1 tablet (20 mg total) by mouth daily.  . metFORMIN (GLUCOPHAGE) 1000 MG tablet Take 1 Tablet by mouth 2 times a day with a meal  . metoprolol tartrate (LOPRESSOR) 50 MG tablet Take 1 tablet (50 mg total) by mouth 2 (two) times daily.   No facility-administered encounter medications on file as of 03/02/2020.    Patient Active Problem List   Diagnosis Date Noted  . Morbid obesity ([name]) 02/24/2015  . Depression 04/29/2014  . Peripheral edema 01/28/2014  . Hypertension 04/09/2010  . Arthritis 04/09/2010  . Diabetes mellitus ([name]) 04/09/2010  . GAD (generalized anxiety disorder) 04/09/2010  . Hyperlipidemia 04/09/2010  . Diabetic neuropathy ([name]) 04/09/2010    Conditions to be addressed/monitored:Hypertension, diabetes, arthritis, glaucoma, neuropathy, GAD, depression  Care Plan : RNCM: Diabetes  Updates made by Gwenith Daily, RN since 03/02/2020 12:00 AM    Problem: Glycemic Management (Diabetes, Type 2)   Priority: Medium    Long-Range Goal: Glycemic Management Optimized   Start Date: 01/28/2020  This Visit's Progress: Not on track  Recent Progress: Not on track  Priority: Medium  Note:   Current Barriers:  .  Chronic Disease Management support and education needs related to diabetes . Does not check blood sugar  . Does not adhere to a diabetic diet  Nurse Case Manager Clinical Goal(s):  [name] Over the next 60 days, patient will work with Medical illustrator to address needs related to self-management of diabetes . Over the next 30 days, patient will schedule routine follow-up appointment with  PCP  Interventions:  . 1:1 collaboration with Bennie Pierini, FNP regarding development and update of comprehensive plan of care as evidenced by provider attestation and co-signature . Inter-disciplinary care team collaboration (see longitudinal plan of care) . Evaluation of current treatment plan related to diabetes and patient's adherence to plan as established by provider. . Chart reviewed including relevant office notes and lab results . Discussed most recent A1C of 8.2 and that it had increased from previous testing . Discussed home blood sugar testing o She does not test at home o She does have a meter . Reinforced need to check and record blood sugar at least 3 times a week  . Advised to call PCP with any readings outside of recommended range . Discussed goal of < 150 fasting and ideally between 100-130 . Reviewed and discussed medications o Patient takes medication as directed and does not have any problems with affordability . Discussed diet o Does not follow a particular diet o Encouraged patient to watch portion sizes and to eat more lean proteins and vegetables than simple carbs . Previously discussed family/social support o Has two sons and a daughter-in-law that are very involved in her care o Has a non-relative caregiver that comes on Fridays to help with household chores o Has support from her church family . Previously discussed mobility and ability to perform ADLs o Lives alone o Some difficulty with walking due to neuropathy in feet and arthritis o Neuropathy is not well controlled with current dose of gabapentin - Patient to discuss with PCP at next office visit o Does not drive but family provides transportation . Reminded to schedule a follow-up visit with PCP o Patient prefers to call and schedule on her own . Provided with RN Care Manager contact number and encouraged to reach out as needed  Patient Goals/Self-Care Activities Over the next 60 days,  patient will: . Check and record blood sugar 3-5 times a week  . check blood sugar if I feel it is too high or too low . take the blood sugar log to all doctor visits . take the blood sugar meter to all doctor visits  . Take medication as prescribed . Call to schedule a routine f/u with Bennie Pierini, FNP . Call PCP if blood sugar readings are outside of recommended range (< 150 fasting with a goal of 100-130)    Care Plan : RNCM: Quality of Life  Updates made by Gwenith Daily, RN since 03/02/2020 12:00 AM    Problem: Quality of Life (General Plan of Care)   Priority: High    Long-Range Goal: Quality of Life Maintained   Start Date: 01/28/2020  This Visit's Progress: On track  Recent Progress: On track  Priority: High  Note:   Current Barriers:  . Unable to independently drive . Decreased vision due to glaucoma . Pain from arthritis and neuropathy  Nurse Case Manager Clinical Goal(s):  [name] Over the next 180 days, patient will maintain current activity level or increase activity level . Over the next 180 days, patient will remain engaged with church and family  for emotional, spiritual, and physical support  Interventions:  . 1:1 collaboration with Bennie Pierini, FNP regarding development and update of comprehensive plan of care as evidenced by provider attestation and co-signature . Inter-disciplinary care team collaboration (see longitudinal plan of care) . Chart reviewed including recent office notes and lab results . Discussed glaucoma and affect of decreased vision on her ability to perform ADLs and activities that she enjoys o She enjoys baking and is still able to bake cakes that she knows the recipes by heart o Has difficulty reading recipes but does wear glasses and has a magnifier that helps . Previously discussed family and social support o Family is very involved and she talks or visits with them daily o She is involved in her church and has good  relationships with church members . Discussed spirituality and how that has helped her manage the stress of her chronic conditions as well as the stress of family member's illnesses  . Reinforced need to remain as physically and socially active as possible . Encouraged patient to reach out to Lorna Few, LCSW for counseling services if interested . Advised to schedule follow-up appointment with PCP o Discuss pain from peripheral neuropathy and possible medication adjustment to better manage pain . Encouraged patient to keep appointments with ophthalmologist  o Unable to improve vision but they are trying to preserve current vision. Patient has made peace with this.  o Follow-up appointment with Dr Dione Booze on 03/03/20 . Provided with RN Care Manager contact number and encouraged to reach out as needed  Patient Goals/Self-Care Activities Over the next 60 days, patient will: . Remain socially active  . Keep good relationship with friends and family . Visit or talk with friends/family daily . Stay engaged in spiritual practices/church . Talk with counselor Lorin Picket Forrest, LCSW) regarding stress related to health conditions . Remain physically active as much as possible . Do something that you enjoy daily (baking, cooking, puzzles, etc) . Keep f/u appt with Dr Dione Booze on 03/03/20 . Schedule routine visit with Bennie Pierini, FNP     Care Plan : RNCM: Hypertension  Updates made by Gwenith Daily, RN since 03/02/2020 12:00 AM    Problem: Hypertension (Hypertension)     Long-Range Goal: Hypertension Monitored   Start Date: 01/28/2020  This Visit's Progress: Not on track  Recent Progress: Not on track  Priority: Medium  Note:   Current Barriers:  . Chronic Disease Management support and education needs related to hypertension management . Does not check her blood pressure regularly . Overdue for f/u with PCP  Nurse Case Manager Clinical Goal(s):  [name] Over the next 60 days, patient will  work with Medical illustrator to address needs related to self-management of hypertension . Over the next 30 days, patient will schedule a routine follow-up appointment with PCP  Interventions:  . 1:1 collaboration with Bennie Pierini, FNP regarding development and update of comprehensive plan of care as evidenced by provider attestation and co-signature . Inter-disciplinary care team collaboration (see longitudinal plan of care) . Evaluation of current treatment plan related to hypertension and patient's adherence to plan as established by provider. . Chart reviewed including relevant office notes and lab results . Reviewed and discussed medications . Discussed home blood pressure readings o Patient has a monitor but does not check blood pressure regularly o Unsure if monitor is accurate . Reinforced need to check and record blood pressure 3 times a week and as needed . Reminded to schedule follow-up  appointment with PCP o Bring blood pressure monitor to visit so that it can be checked against the office machine for accuracy o Bring blood pressure log to PCP visit . Call PCP with any readings outside of recommended range . Previously discussed diet o Does not follow a particular diet o Encouraged to watch sodium in diet . Previously discussed mobility and activity level o Enjoys baking/cooking o Has some difficulty with ambulation due to neuropathy and arthritis o Decreased vision in L eye o At risk for falls . Previously discussed family and social support o Involved in church o Strong spiritual faith o Has help from two sons and daughter-in-law as well as a non-relative caregiver . Provided with RN Care Manager contact number and encouraged to reach out as needed  Patient Goals/Self-Care Activities Over the next 60 days, patient will: . check blood pressure 3 times per week and as needed . write blood pressure results in a log or diary  . Bring blood pressure log to medical  appointments . Bring blood pressure machine to next visit to check accuracy against office machine . Call PCP if blood pressure readings are outside of recommended range . Watch sodium in diet . Take medications as prescribed . Call to schedule a routine f/u with Bennie Pierini, FNP      Follow Up Plan:  . Telephone follow up appointment with care management team member scheduled for: 04/21/2020 with RN Care Manager . The patient has been provided with contact information for the care management team and has been advised to call with any health related questions or concerns.  . The patient will call [phone] as advised to schedule a routine follow-up with Bennie Pierini, FNP.   Demetrios Loll, BSN, RN-BC Embedded Chronic Care Manager [facility] Care Management Direct Dial: [phone]

## 2020-03-02 NOTE — Patient Instructions (Signed)
Visit Information  PATIENT GOALS: Goals Addressed            This Visit's Progress   . Matintain My Quality of Life   On track    Timeframe:  Long-Range Goal Priority:  High Start Date:                             Expected End Date:                       Follow Up Date 04/21/20   . Remain socially active  . Keep good relationship with friends and family . Visit or talk with friends/family daily . Stay engaged in spiritual practices/church . Talk with counselor Lorin Picket Forrest, LCSW) regarding stress related to health conditions . Remain physically active as much as possible . Do something that you enjoy daily (baking, cooking, puzzles, etc) . Keep f/u appt with Dr Dione Booze on 03/03/20 . Schedule routine visit with Bennie Pierini, FNP   Why is this important?    Having a long-term illness can be scary.   It can also be stressful for you and your caregiver.   These steps may help.    Notes:     [name] Monitor and Manage My Blood Sugar-Diabetes Type 2   Not on track    Timeframe:  Long-Range Goal Priority:  Medium Start Date:                             Expected End Date:                       Follow Up Date 04/21/20   . Check and record blood sugar 3-5 times a week  . check blood sugar if I feel it is too high or too low . take the blood sugar log to all doctor visits . take the blood sugar meter to all doctor visits  . Take medication as prescribed . Call to schedule a routine f/u with Bennie Pierini, FNP . Call PCP if blood sugar readings are outside of recommended range (< 150 fasting with a goal of 100-130)   Why is this important?    Checking your blood sugar at home helps to keep it from getting very high or very low.   Writing the results in a diary or log helps the doctor know how to care for you.   Your blood sugar log should have the time, date and the results.   Also, write down the amount of insulin or other medicine that you take.   Other  information, like what you ate, exercise done and how you were feeling, will also be helpful.     Notes:     . Track and Manage My Blood Pressure-Hypertension   Not on track    Timeframe:  Long-Range Goal Priority:  Medium Start Date:                             Expected End Date:                       Follow Up Date 04/21/20   . check blood pressure 3 times per week and as needed . write blood pressure results in a log or diary  . Bring  blood pressure log to medical appointments . Bring blood pressure machine to next visit to check accuracy against office machine . Call PCP if blood pressure readings are outside of recommended range . Watch sodium in diet . Take medications as prescribed . Call to schedule a routine f/u with Bennie Pierini, FNP   Why is this important?    You won't feel high blood pressure, but it can still hurt your blood vessels.   High blood pressure can cause heart or kidney problems. It can also cause a stroke.   Making lifestyle changes like losing a little weight or eating less salt will help.   Checking your blood pressure at home and at different times of the day can help to control blood pressure.   If the doctor prescribes medicine remember to take it the way the doctor ordered.   Call the office if you cannot afford the medicine or if there are questions about it.     Notes:        Patient verbalizes understanding of instructions provided today and agrees to view in MyChart.   Follow Up Plan:  . Telephone follow up appointment with care management team member scheduled for: 04/21/2020 with RN Care Manager . The patient has been provided with contact information for the care management team and has been advised to call with any health related questions or concerns.  . The patient will call [phone] as advised to schedule a routine follow-up with Bennie Pierini, FNP.   Demetrios Loll, BSN, RN-BC Embedded Chronic Care  Manager [facility] / [facility] Care Management Direct Dial: [phone]

## 2020-03-03 DIAGNOSIS — H401133 Primary open-angle glaucoma, bilateral, severe stage: Secondary | ICD-10-CM | POA: Diagnosis not present

## 2020-03-03 DIAGNOSIS — H04123 Dry eye syndrome of bilateral lacrimal glands: Secondary | ICD-10-CM | POA: Diagnosis not present

## 2020-03-03 DIAGNOSIS — Z961 Presence of intraocular lens: Secondary | ICD-10-CM | POA: Diagnosis not present

## 2020-03-03 DIAGNOSIS — E119 Type 2 diabetes mellitus without complications: Secondary | ICD-10-CM | POA: Diagnosis not present

## 2020-04-12 ENCOUNTER — Other Ambulatory Visit: Payer: Self-pay | Admitting: Nurse Practitioner

## 2020-04-12 DIAGNOSIS — E782 Mixed hyperlipidemia: Secondary | ICD-10-CM

## 2020-04-19 ENCOUNTER — Ambulatory Visit (INDEPENDENT_AMBULATORY_CARE_PROVIDER_SITE_OTHER): Payer: Medicare HMO | Admitting: Nurse Practitioner

## 2020-04-19 ENCOUNTER — Encounter: Payer: Self-pay | Admitting: Nurse Practitioner

## 2020-04-19 DIAGNOSIS — J0101 Acute recurrent maxillary sinusitis: Secondary | ICD-10-CM

## 2020-04-19 MED ORDER — BENZONATATE 100 MG PO CAPS: 100.0000 mg | ORAL_CAPSULE | Freq: Three times a day (TID) | ORAL | 0 refills | Status: DC | PRN

## 2020-04-19 MED ORDER — AZITHROMYCIN 250 MG PO TABS: ORAL_TABLET | ORAL | 0 refills | Status: DC

## 2020-04-19 NOTE — Progress Notes (Signed)
Virtual Visit  Note Due to COVID-19 pandemic this visit was conducted virtually. This visit type was conducted due to national recommendations for restrictions regarding the COVID-19 Pandemic (e.g. social distancing, sheltering in place) in an effort to limit this patient's exposure and mitigate transmission in our community. All issues noted in this document were discussed and addressed.  A physical exam was not performed with this format.  I connected with Kimberly Mcdowell on 04/19/20 at 9:44 by telephone  and verified that I am speaking with the correct person using two identifiers. Kimberly Mcdowell is currently located at home and no one  is currently with her during visit. The provider, Mary-Margaret Daphine Deutscher, FNP is located in their office at time of visit.  I discussed the limitations, risks, security and privacy concerns of performing an evaluation and management service by M martin,FNP and the availability of in person appointments. I also discussed with the patient that there may be a patient responsible charge related to this service. The patient expressed understanding and agreed to proceed.   History and Present Illness:   Chief Complaint: Sinusitis   HPI Patient calls in c/o cough and congestion that has been going on for greater then 2 weeks. Feels worse in the last several days.   Review of Systems  Constitutional: Positive for malaise/fatigue. Negative for chills and fever.  HENT: Positive for congestion and sinus pain. Negative for sore throat.   Respiratory: Positive for cough. Negative for sputum production and shortness of breath.   Neurological: Negative for dizziness and headaches.     Observations/Objective: Alert and oriented- answers all questions appropriately No distress Voice hoarse Dry cough during visit  Assessment and Plan: Kimberly Mcdowell in today with chief complaint of Sinusitis   1. Acute recurrent maxillary sinusitis 1. Take meds as  prescribed 2. Use a cool mist humidifier especially during the winter months and when heat has been humid. 3. Use saline nose sprays frequently 4. Saline irrigations of the nose can be very helpful if done frequently.  * 4X daily for 1 week*  * Use of a nettie pot can be helpful with this. Follow directions with this* 5. Drink plenty of fluids 6. Keep thermostat turn down low 7.For any cough or congestion  Use plain Mucinex- regular strength or max strength is fine   * Children- consult with Pharmacist for dosing 8. For fever or aces or pains- take tylenol or ibuprofen appropriate for age and weight.  * for fevers greater than 101 orally you may alternate ibuprofen and tylenol every  3 hours.    - benzonatate (TESSALON PERLES) 100 MG capsule; Take 1 capsule (100 mg total) by mouth 3 (three) times daily as needed.  Dispense: 20 capsule; Refill: 0 - azithromycin (ZITHROMAX Z-PAK) 250 MG tablet; As directed  Dispense: 6 tablet; Refill: 0      Follow Up Instructions: prn    I discussed the assessment and treatment plan with the patient. The patient was provided an opportunity to ask questions and all were answered. The patient agreed with the plan and demonstrated an understanding of the instructions.   The patient was advised to call back or seek an in-person evaluation if the symptoms worsen or if the condition fails to improve as anticipated.  The above assessment and management plan was discussed with the patient. The patient verbalized understanding of and has agreed to the management plan. Patient is aware to call the clinic if symptoms persist or worsen.  Patient is aware when to return to the clinic for a follow-up visit. Patient educated on when it is appropriate to go to the emergency department.   Time call ended:  9:56  I provided *12** minutes of   face-to-face time during this encounter.    Mary-Margaret Daphine Deutscher, FNP

## 2020-04-21 ENCOUNTER — Ambulatory Visit (INDEPENDENT_AMBULATORY_CARE_PROVIDER_SITE_OTHER): Payer: Medicare HMO | Admitting: *Deleted

## 2020-04-21 DIAGNOSIS — E119 Type 2 diabetes mellitus without complications: Secondary | ICD-10-CM | POA: Diagnosis not present

## 2020-04-21 DIAGNOSIS — I10 Essential (primary) hypertension: Secondary | ICD-10-CM

## 2020-04-21 DIAGNOSIS — Z794 Long term (current) use of insulin: Secondary | ICD-10-CM

## 2020-04-21 NOTE — Patient Instructions (Signed)
Visit Information  PATIENT GOALS: Goals Addressed            This Visit's Progress   . Monitor and Manage My Blood Sugar-Diabetes Type 2   Not on track    Timeframe:  Long-Range Goal Priority:  Medium Start Date:                             Expected End Date:                       Follow Up Date 05/18/20  . Check and record blood sugar 3-5 times a week  . check blood sugar if I feel it is too high or too low . take the blood sugar log to all doctor visits . take the blood sugar meter to all doctor visits  . Take medication as prescribed . Call to schedule a routine f/u with Bennie Pierini, FNP . Follow a DASH diet and limit simple carbs . Call PCP if blood sugar readings are outside of recommended range (< 150 fasting with a goal of 100-130) . Call RN Care Manager as needed [phone]   Why is this important?    Checking your blood sugar at home helps to keep it from getting very high or very low.   Writing the results in a diary or log helps the doctor know how to care for you.   Your blood sugar log should have the time, date and the results.   Also, write down the amount of insulin or other medicine that you take.   Other information, like what you ate, exercise done and how you were feeling, will also be helpful.     Notes:     . Track and Manage My Blood Pressure-Hypertension   Not on track    Timeframe:  Long-Range Goal Priority:  Medium Start Date:                             Expected End Date:                       Follow Up Date 05/18/20   . check blood pressure 3 times per week and as needed . write blood pressure results in a log or diary  . Bring blood pressure log to medical appointments . Bring blood pressure machine to next visit to check accuracy against office machine . Call PCP if blood pressure readings are outside of recommended range . DASH diet and limit simple carbohydrates and sugars . Take medications as prescribed . Call to  schedule a routine f/u with Bennie Pierini, FNP . Call RN Care Manager as needed [phone]   Why is this important?    You won't feel high blood pressure, but it can still hurt your blood vessels.   High blood pressure can cause heart or kidney problems. It can also cause a stroke.   Making lifestyle changes like losing a little weight or eating less salt will help.   Checking your blood pressure at home and at different times of the day can help to control blood pressure.   If the doctor prescribes medicine remember to take it the way the doctor ordered.   Call the office if you cannot afford the medicine or if there are questions about it.     Notes:  Patient verbalizes understanding of instructions provided today and agrees to view in MyChart.   Follow Up Plan:  . Telephone follow up appointment with care management team member scheduled for: 05/18/20 with RN Care Manager . The patient has been provided with contact information for the care management team and has been advised to call with any health related questions or concerns.  . The patient will call [phone] as advised to schedule a routine follow-up with Bennie Pierini, FNP.   Kimberly Mcdowell, BSN, RN-BC Embedded Chronic Care Manager [facility] / [facility] Care Management Direct Dial: [phone]

## 2020-04-21 NOTE — Chronic Care Management (AMB) (Signed)
Chronic Care Management   CCM RN Visit Note  04/21/2020 Name: Kimberly Mcdowell MRN: [id number] DOB: [birth year]  Subjective: Kimberly Mcdowell is a 87 y.o. year old female who is a primary care patient of Bennie Pierini, FNP. The care management team was consulted for assistance with disease management and care coordination needs.    Engaged with patient by telephone for follow up visit in response to provider referral for case management and/or care coordination services.   Consent to Services:  The patient was given information about Chronic Care Management services, agreed to services, and gave verbal consent prior to initiation of services.  Please see initial visit note for detailed documentation.   Patient agreed to services and verbal consent obtained.   Assessment: Review of patient past medical history, allergies, medications, health status, including review of consultants reports, laboratory and other test data, was performed as part of comprehensive evaluation and provision of chronic care management services.   SDOH (Social Determinants of Health) assessments and interventions performed:    CCM Care Plan  Allergies  Allergen Reactions  . Ezetimibe-Simvastatin Other (See Comments)    Muscle aches  . Lipitor [Atorvastatin Calcium] Other (See Comments)    Muscle aches    Outpatient Encounter Medications as of 04/21/2020  Medication Sig  . cephALEXin (KEFLEX) 500 MG capsule TAKE 1 CAPSULE BY MOUTH 2 TIMES DAILY.  [name] ALPRAZolam (XANAX) 0.25 MG tablet Take 1 tablet (0.25 mg total) by mouth at bedtime.  [name] amLODipine (NORVASC) 5 MG tablet Take 1 tablet (5 mg total) by mouth daily.  [name] atorvastatin (LIPITOR) 40 MG tablet Take 1 tablet (40 mg total) by mouth daily. (NEEDS TO BE SEEN BEFORE NEXT REFILL)  . azithromycin (ZITHROMAX Z-PAK) 250 MG tablet As directed  . benzonatate (TESSALON PERLES) 100 MG capsule Take 1 capsule (100 mg total) by mouth 3 (three) times daily as  needed.  . Cholecalciferol (VITAMIN D3) 2000 UNITS TABS Take 2,000 Int'l Units by mouth daily.    . citalopram (CELEXA) 40 MG tablet Take 1 tablet (40 mg total) by mouth daily.  . furosemide (LASIX) 40 MG tablet Take 1 tablet (40 mg total) by mouth daily.  [name] gabapentin (NEURONTIN) 100 MG capsule Take 1 capsule (100 mg total) by mouth at bedtime. (Needs to be seen before next refill)  . glipiZIDE (GLIPIZIDE XL) 2.5 MG 24 hr tablet Take 1 tablet (2.5 mg total) by mouth daily.  [name] glucose blood (ACCU-CHEK SMARTVIEW) test strip Test blood glucose up to three times a day  . lisinopril (ZESTRIL) 20 MG tablet Take 1 tablet (20 mg total) by mouth daily. (NEEDS TO BE SEEN BEFORE NEXT REFILL)  . metFORMIN (GLUCOPHAGE) 1000 MG tablet Take 1 Tablet by mouth 2 times a day with a meal  . metoprolol tartrate (LOPRESSOR) 50 MG tablet Take 1 tablet (50 mg total) by mouth 2 (two) times daily.   No facility-administered encounter medications on file as of 04/21/2020.    Patient Active Problem List   Diagnosis Date Noted  . Morbid obesity ([name]) 02/24/2015  . Depression 04/29/2014  . Peripheral edema 01/28/2014  . Hypertension 04/09/2010  . Arthritis 04/09/2010  . Diabetes mellitus ([name]) 04/09/2010  . GAD (generalized anxiety disorder) 04/09/2010  . Hyperlipidemia 04/09/2010  . Diabetic neuropathy ([name]) 04/09/2010    Conditions to be addressed/monitored:HTN and DMII  Care Plan : RNCM: Diabetes  Updates made by Gwenith Daily, RN since 04/21/2020 12:00 AM    Problem: Glycemic Management (Diabetes,  Type 2)   Priority: Medium    Long-Range Goal: Glycemic Management Optimized   Start Date: 01/28/2020  This Visit's Progress: Not on track  Recent Progress: Not on track  Priority: Medium  Note:   Objective: Lab Results  Component Value Date   HGBA1C 8.4 (H) 05/29/2019   HGBA1C 7.2 (H) 02/19/2019   HGBA1C 7.1 (H) 02/11/2018   Lab Results  Component Value Date   LDLCALC 134 (H) 05/29/2019    CREATININE 0.95 05/29/2019   Current Barriers:  . Chronic Disease Management support and education needs related to diabetes . Does not check blood sugar  . Does not adhere to a diabetic diet  Nurse Case Manager Clinical Goal(s):  . patient will work with PCP to address needs related to medical management diabetes . patient will meet with RN Care Manager to address self-management of diabetes . Patient will schedule routine follow-up appointment with PCP  Interventions:  . 1:1 collaboration with Bennie Pierini, FNP regarding development and update of comprehensive plan of care as evidenced by provider attestation and co-signature . Inter-disciplinary care team collaboration (see longitudinal plan of care) . Evaluation of current treatment plan related to diabetes and patient's adherence to plan as established by provider. . Chart reviewed including relevant office notes and lab results . Discussed recent office visit with PCP two days ago for URI. Patient is feeling better at this time. . Discussed most recent A1C of 8.2 and that it had increased from previous testing . Discussed home blood sugar testing o She does not test at home o She does have a meter . Reinforced need to check and record blood sugar at least 3 times a week  . Advised to call PCP with any readings outside of recommended range . Previously discussed goal of < 150 fasting and ideally between 100-130 . Reviewed and discussed medications o Patient takes medication as directed and does not have any problems with affordability . Discussed diet o Does not follow a parti o cular diet o Education provided . Previously discussed family/social support o Has two sons and a daughter-in-law that are very involved in her care o Has a non-relative caregiver that comes on Fridays to help with household chores o Has support from her church family . Previously discussed mobility and ability to perform ADLs o Lives  alone o Some difficulty with walking due to neuropathy in feet and arthritis o Neuropathy is not well controlled with current dose of gabapentin - Patient to discuss with PCP at next office visit o Does not drive but family provides transportation . Reminded to schedule a follow-up visit with PCP o Patient will call to schedule visit  . Provided with RN Care Manager contact number and encouraged to reach out as needed  Patient Goals/Self-Care Activities Over the next 60 days, patient will: . Check and record blood sugar 3-5 times a week  . check blood sugar if I feel it is too high or too low . take the blood sugar log to all doctor visits . take the blood sugar meter to all doctor visits  . Take medication as prescribed . Call to schedule a routine f/u with Bennie Pierini, FNP . Follow a DASH diet and limit simple carbs . Call PCP if blood sugar readings are outside of recommended range (< 150 fasting with a goal of 100-130) . Call RN Care Manager as needed [phone]    Care Plan : RNCM: Hypertension  Updates made by Demetrios Loll  N, RN since 04/21/2020 12:00 AM    Problem: Hypertension (Hypertension)     Long-Range Goal: Hypertension Monitored   Start Date: 01/28/2020  This Visit's Progress: Not on track  Recent Progress: Not on track  Priority: Medium  Note:   Current Barriers:  . Chronic Disease Management support and education needs related to hypertension management . Does not check her blood pressure regularly . Overdue for f/u with PCP  Nurse Case Manager Clinical Goal(s):  . patient will work with PCP to address needs related to medical management of hypertension . patient will meet with RN Care Manager to address self-management of hypertension . Over the next 30 days, patient will schedule a routine follow-up appointment with PCP  Interventions:  . 1:1 collaboration with Bennie Pierini, FNP regarding development and update of comprehensive plan of  care as evidenced by provider attestation and co-signature . Inter-disciplinary care team collaboration (see longitudinal plan of care) . Evaluation of current treatment plan related to hypertension and patient's adherence to plan as established by provider. . Chart reviewed including relevant office notes and lab results . Reviewed and discussed medications . Discussed home blood pressure readings o Patient has a monitor but does not check blood pressure regularly o Unsure if monitor is accurate . Reinforced need to check and record blood pressure 3 times a week and as needed . Reminded to schedule follow-up appointment with PCP o Bring blood pressure monitor to visit so that it can be checked against the office machine for accuracy o Bring blood pressure log to PCP visit . Call PCP with any readings outside of recommended range . Previously discussed diet o Does not follow a particular diet o Encouraged DASH diet . Previously discussed mobility and activity level o Enjoys baking/cooking o Has some difficulty with ambulation due to neuropathy and arthritis o Decreased vision in L eye o At risk for falls . Previously discussed family and social support o Involved in church o Strong spiritual faith o Has help from two sons and daughter-in-law as well as a non-relative caregiver . Provided with RN Care Manager contact number and encouraged to reach out as needed  Patient Goals/Self-Care Activities Over the next 60 days, patient will: . check blood pressure 3 times per week and as needed . write blood pressure results in a log or diary  . Bring blood pressure log to medical appointments . Bring blood pressure machine to next visit to check accuracy against office machine . Call PCP if blood pressure readings are outside of recommended range . DASH diet and limit simple carbohydrates and sugars . Take medications as prescribed . Call to schedule a routine f/u with Bennie Pierini, FNP . Call RN Care Manager as needed [phone]      Follow Up Plan:  . Telephone follow up appointment with care management team member scheduled for: 05/18/20 with RN Care Manager . The patient has been provided with contact information for the care management team and has been advised to call with any health related questions or concerns.  . The patient will call [phone] as advised to schedule a routine follow-up with Bennie Pierini, FNP.   Demetrios Loll, BSN, RN-BC Embedded Chronic Care Manager [facility] Care Management Direct Dial: [phone]

## 2020-04-30 ENCOUNTER — Telehealth: Payer: Medicare HMO

## 2020-05-18 ENCOUNTER — Ambulatory Visit (INDEPENDENT_AMBULATORY_CARE_PROVIDER_SITE_OTHER): Payer: Medicare HMO | Admitting: *Deleted

## 2020-05-18 DIAGNOSIS — Z794 Long term (current) use of insulin: Secondary | ICD-10-CM | POA: Diagnosis not present

## 2020-05-18 DIAGNOSIS — E119 Type 2 diabetes mellitus without complications: Secondary | ICD-10-CM

## 2020-05-18 DIAGNOSIS — I10 Essential (primary) hypertension: Secondary | ICD-10-CM

## 2020-05-25 ENCOUNTER — Other Ambulatory Visit: Payer: Self-pay | Admitting: Nurse Practitioner

## 2020-05-25 DIAGNOSIS — E782 Mixed hyperlipidemia: Secondary | ICD-10-CM

## 2020-06-11 NOTE — Patient Instructions (Signed)
Visit Information  PATIENT GOALS: Goals Addressed            This Visit's Progress   . Monitor and Manage My Blood Sugar-Diabetes Type 2   Not on track    Timeframe:  Long-Range Goal Priority:  Medium Start Date:                             Expected End Date:                       Follow Up Date 06/17/20  . Check and record blood sugar 3-5 times a week  . check blood sugar if I feel it is too high or too low . take the blood sugar log to all doctor visits . take the blood sugar meter to all doctor visits  . Take medication as prescribed . Keep all medical appointments and follow-up with PCP regularly . Follow a DASH diet and limit simple carbs . Call PCP if blood sugar readings are outside of recommended range (< 150 fasting with a goal of 100-130) . Call RN Care Manager as needed [phone]   Why is this important?    Checking your blood sugar at home helps to keep it from getting very high or very low.   Writing the results in a diary or log helps the doctor know how to care for you.   Your blood sugar log should have the time, date and the results.   Also, write down the amount of insulin or other medicine that you take.   Other information, like what you ate, exercise done and how you were feeling, will also be helpful.     Notes:        Patient verbalizes understanding of instructions provided today and agrees to view in MyChart.    Follow Up Plan:  . Telephone follow up appointment with care management team member scheduled for: 06/17/20 with RNCM . The patient has been provided with contact information for the care management team and has been advised to call with any health related questions or concerns.

## 2020-06-11 NOTE — Chronic Care Management (AMB) (Signed)
Chronic Care Management   CCM RN Visit Note  05/18/2020 Name: Kimberly Mcdowell MRN: [id number] DOB: 03-15-1930  Subjective: Kimberly Mcdowell is a 87 y.o. year old female who is a primary care patient of Bennie Pierini, FNP. The care management team was consulted for assistance with disease management and care coordination needs.    Engaged with patient by telephone for follow up visit in response to provider referral for case management and/or care coordination services.   Consent to Services:  The patient was given information about Chronic Care Management services, agreed to services, and gave verbal consent prior to initiation of services.  Please see initial visit note for detailed documentation.   Patient agreed to services and verbal consent obtained.   Assessment: Review of patient past medical history, allergies, medications, health status, including review of consultants reports, laboratory and other test data, was performed as part of comprehensive evaluation and provision of chronic care management services.   SDOH (Social Determinants of Health) assessments and interventions performed:    CCM Care Plan  Allergies  Allergen Reactions  . Ezetimibe-Simvastatin Other (See Comments)    Muscle aches  . Lipitor [Atorvastatin Calcium] Other (See Comments)    Muscle aches    Outpatient Encounter Medications as of 05/18/2020  Medication Sig  . cephALEXin (KEFLEX) 500 MG capsule TAKE 1 CAPSULE BY MOUTH 2 TIMES Mcdowell.  [name] ALPRAZolam (XANAX) 0.25 MG tablet Take 1 tablet (0.25 mg total) by mouth at bedtime.  [name] amLODipine (NORVASC) 5 MG tablet Take 1 tablet (5 mg total) by mouth Mcdowell.  [name] azithromycin (ZITHROMAX Z-PAK) 250 MG tablet As directed  . benzonatate (TESSALON PERLES) 100 MG capsule Take 1 capsule (100 mg total) by mouth 3 (three) times Mcdowell as needed.  . Cholecalciferol (VITAMIN D3) 2000 UNITS TABS Take 2,000 Int'l Units by mouth Mcdowell.    . citalopram (CELEXA) 40 MG  tablet Take 1 tablet (40 mg total) by mouth Mcdowell.  . furosemide (LASIX) 40 MG tablet Take 1 tablet (40 mg total) by mouth Mcdowell.  [name] gabapentin (NEURONTIN) 100 MG capsule Take 1 capsule (100 mg total) by mouth at bedtime. (Needs to be seen before next refill)  . glipiZIDE (GLIPIZIDE XL) 2.5 MG 24 hr tablet Take 1 tablet (2.5 mg total) by mouth Mcdowell.  [name] glucose blood (ACCU-CHEK SMARTVIEW) test strip Test blood glucose up to three times a day  . metFORMIN (GLUCOPHAGE) 1000 MG tablet Take 1 Tablet by mouth 2 times a day with a meal  . metoprolol tartrate (LOPRESSOR) 50 MG tablet Take 1 tablet (50 mg total) by mouth 2 (two) times Mcdowell.  . [DISCONTINUED] atorvastatin (LIPITOR) 40 MG tablet Take 1 tablet (40 mg total) by mouth Mcdowell. (NEEDS TO BE SEEN BEFORE NEXT REFILL)  . [DISCONTINUED] lisinopril (ZESTRIL) 20 MG tablet Take 1 tablet (20 mg total) by mouth Mcdowell. (NEEDS TO BE SEEN BEFORE NEXT REFILL)   No facility-administered encounter medications on file as of 05/18/2020.    Patient Active Problem List   Diagnosis Date Noted  . Morbid obesity ([name]) 02/24/2015  . Depression 04/29/2014  . Peripheral edema 01/28/2014  . Hypertension 04/09/2010  . Arthritis 04/09/2010  . Diabetes mellitus ([name]) 04/09/2010  . GAD (generalized anxiety disorder) 04/09/2010  . Hyperlipidemia 04/09/2010  . Diabetic neuropathy ([name]) 04/09/2010    Conditions to be addressed/monitored:HTN and DMII  Care Plan : RNCM: Diabetes  Updates made by Kimberly Daily, RN since 06/11/2020 12:00 AM    Problem: Glycemic  Management (Diabetes, Type 2)   Priority: Medium    Long-Range Goal: Glycemic Management Optimized   Start Date: 01/28/2020  This Visit's Progress: Not on track  Recent Progress: Not on track  Priority: Medium  Note:   Objective: Lab Results  Component Value Date   HGBA1C 8.4 (H) 05/29/2019   HGBA1C 7.2 (H) 02/19/2019   HGBA1C 7.1 (H) 02/11/2018   Lab Results  Component Value Date   LDLCALC 134  (H) 05/29/2019   CREATININE 0.95 05/29/2019   Current Barriers:  . Chronic Disease Management support and education needs related to diabetes . Does not check blood sugar  . Does not adhere to a diabetic diet  Nurse Case Manager Clinical Goal(s):  . patient will work with PCP to address needs related to medical management diabetes . patient will meet with RN Care Manager to address self-management of diabetes . Patient will schedule routine follow-up appointment with PCP  Interventions:  . 1:1 collaboration with Bennie Pierini, FNP regarding development and update of comprehensive plan of care as evidenced by provider attestation and co-signature . Inter-disciplinary care team collaboration (see longitudinal plan of care) . Evaluation of current treatment plan related to diabetes and patient's adherence to plan as established by provider. . Chart reviewed including relevant office notes and lab results . Discussed most recent A1C of 8.2 and that it had increased from previous testing . Discussed home blood sugar testing o She does not test at home o She does have a meter . Reinforced need to check and record blood sugar at least 3 times a week  . Advised to call PCP with any readings outside of recommended range . Previously discussed goal of < 150 fasting and ideally between 100-130 . Reviewed medications and importance of compliance . Encouraged an ADH/carb modified diet . Encouraged increased activity level as tolerated with a goal of at least 150 minutes a week . Encouraged patient to follow-up with PCP regularly as recommended . Provided with RN Care Manager contact number and encouraged to reach out as needed  Patient Goals/Self-Care Activities Over the next 60 days, patient will: . Check and record blood sugar 3-5 times a week  . check blood sugar if I feel it is too high or too low . take the blood sugar log to all doctor visits . take the blood sugar meter to all  doctor visits  . Take medication as prescribed . Keep all medical appointments and follow-up with PCP regularly . Follow a DASH diet and limit simple carbs . Call PCP if blood sugar readings are outside of recommended range (< 150 fasting with a goal of 100-130) . Call RN Care Manager as needed [phone]     Follow Up Plan:  . Telephone follow up appointment with care management team member scheduled for: 06/17/20 with RNCM . The patient has been provided with contact information for the care management team and has been advised to call with any health related questions or concerns.   Demetrios Loll, BSN, RN-BC Embedded Chronic Care Manager [facility] Care Management Direct Dial: [phone]

## 2020-06-17 ENCOUNTER — Encounter: Payer: Self-pay | Admitting: *Deleted

## 2020-06-17 ENCOUNTER — Ambulatory Visit (INDEPENDENT_AMBULATORY_CARE_PROVIDER_SITE_OTHER): Payer: Medicare HMO | Admitting: *Deleted

## 2020-06-17 DIAGNOSIS — Z794 Long term (current) use of insulin: Secondary | ICD-10-CM | POA: Diagnosis not present

## 2020-06-17 DIAGNOSIS — E119 Type 2 diabetes mellitus without complications: Secondary | ICD-10-CM | POA: Diagnosis not present

## 2020-06-17 DIAGNOSIS — I10 Essential (primary) hypertension: Secondary | ICD-10-CM | POA: Diagnosis not present

## 2020-06-23 NOTE — Patient Instructions (Signed)
Visit Information  PATIENT GOALS: Goals Addressed            This Visit's Progress   . Matintain My Quality of Life   On track    Timeframe:  Long-Range Goal Priority:  High Start Date:                             Expected End Date:                       Follow Up Date 07/28/20   . Remain socially active  . Keep good relationship with friends and family . Visit or talk with friends/family daily . Stay engaged in spiritual practices/church . Talk with counselor Lorin Picket Forrest, LCSW) regarding stress related to health conditions . Remain physically active as much as possible . Do something that you enjoy daily (baking, cooking, puzzles, etc) . Keep all medical appointments . Schedule routine visit with Bennie Pierini, FNP by calling [phone]   Why is this important?    Having a long-term illness can be scary.   It can also be stressful for you and your caregiver.   These steps may help.    Notes:     [name] Monitor and Manage My Blood Sugar-Diabetes Type 2   Not on track    Timeframe:  Long-Range Goal Priority:  Medium Start Date:                             Expected End Date:                       Follow Up Date 07/28/20  . Check and record blood sugar 3-5 times a week  . check blood sugar if I feel it is too high or too low . take the blood sugar log to all doctor visits . take the blood sugar meter to all doctor visits  . Take medication as prescribed . Call [facility] to schedule a routine follow-up with PCP [phone] . Follow a DASH diet and limit simple carbs . Call PCP if blood sugar readings are outside of recommended range (< 150 fasting with a goal of 100-130) . Call RN Care Manager as needed [phone]   Why is this important?    Checking your blood sugar at home helps to keep it from getting very high or very low.   Writing the results in a diary or log helps the doctor know how to care for you.   Your blood sugar log should have the time, date  and the results.   Also, write down the amount of insulin or other medicine that you take.   Other information, like what you ate, exercise done and how you were feeling, will also be helpful.     Notes:     . Track and Manage My Blood Pressure-Hypertension   Not on track    Timeframe:  Long-Range Goal Priority:  Medium Start Date:                             Expected End Date:                       Follow Up Date 07/28/20   . check blood pressure 3 times per week and as  needed . write blood pressure results in a log or diary  . Bring blood pressure log to medical appointments . Bring blood pressure machine to next visit to check accuracy against office machine . Call PCP if blood pressure readings are outside of recommended range . DASH diet and limit simple carbohydrates and sugars . Take medications as prescribed . Call to schedule a routine f/u with Bennie Pierini, FNP . Call RN Care Manager as needed [phone]   Why is this important?    You won't feel high blood pressure, but it can still hurt your blood vessels.   High blood pressure can cause heart or kidney problems. It can also cause a stroke.   Making lifestyle changes like losing a little weight or eating less salt will help.   Checking your blood pressure at home and at different times of the day can help to control blood pressure.   If the doctor prescribes medicine remember to take it the way the doctor ordered.   Call the office if you cannot afford the medicine or if there are questions about it.     Notes:        Patient verbalizes understanding of instructions provided today and agrees to view in MyChart.    Follow Up Plan:  . Telephone follow up appointment with care management team member scheduled for: 07/28/20 with RN Care Manager . The patient has been provided with contact information for the care management team and has been advised to call with any health related questions or concerns.   . The patient will call [phone] as advised to schedule a routine follow-up with Bennie Pierini, FNP.  Demetrios Loll, BSN, RN-BC Embedded Chronic Care Manager [facility] Care Management Direct Dial: [phone]

## 2020-06-23 NOTE — Chronic Care Management (AMB) (Signed)
Chronic Care Management   CCM RN Visit Note  06/17/2020 Name: Kimberly Mcdowell MRN: [id number] DOB: 07/23/1930  Subjective: Kimberly Mcdowell is a 87 y.o. year old female who is a primary care patient of Bennie Pierini, FNP. The care management team was consulted for assistance with disease management and care coordination needs.    Engaged with patient by telephone for follow up visit in response to provider referral for case management and/or care coordination services.   Consent to Services:  The patient was given information about Chronic Care Management services, agreed to services, and gave verbal consent prior to initiation of services.  Please see initial visit note for detailed documentation.   Patient agreed to services and verbal consent obtained.   Assessment: Review of patient past medical history, allergies, medications, health status, including review of consultants reports, laboratory and other test data, was performed as part of comprehensive evaluation and provision of chronic care management services.   SDOH (Social Determinants of Health) assessments and interventions performed:    CCM Care Plan  Allergies  Allergen Reactions  . Ezetimibe-Simvastatin Other (See Comments)    Muscle aches  . Lipitor [Atorvastatin Calcium] Other (See Comments)    Muscle aches    Outpatient Encounter Medications as of 06/17/2020  Medication Sig  . cephALEXin (KEFLEX) 500 MG capsule TAKE 1 CAPSULE BY MOUTH 2 TIMES DAILY.  [name] ALPRAZolam (XANAX) 0.25 MG tablet Take 1 tablet (0.25 mg total) by mouth at bedtime.  [name] amLODipine (NORVASC) 5 MG tablet Take 1 tablet (5 mg total) by mouth daily.  [name] atorvastatin (LIPITOR) 40 MG tablet Take 1 tablet (40 mg total) by mouth daily. (NEEDS TO BE SEEN BEFORE NEXT REFILL)  . azithromycin (ZITHROMAX Z-PAK) 250 MG tablet As directed  . benzonatate (TESSALON PERLES) 100 MG capsule Take 1 capsule (100 mg total) by mouth 3 (three) times daily as  needed.  . Cholecalciferol (VITAMIN D3) 2000 UNITS TABS Take 2,000 Int'l Units by mouth daily.    . citalopram (CELEXA) 40 MG tablet Take 1 tablet (40 mg total) by mouth daily.  . furosemide (LASIX) 40 MG tablet Take 1 tablet (40 mg total) by mouth daily.  [name] gabapentin (NEURONTIN) 100 MG capsule Take 1 capsule (100 mg total) by mouth at bedtime. (Needs to be seen before next refill)  . glipiZIDE (GLIPIZIDE XL) 2.5 MG 24 hr tablet Take 1 tablet (2.5 mg total) by mouth daily.  [name] glucose blood (ACCU-CHEK SMARTVIEW) test strip Test blood glucose up to three times a day  . lisinopril (ZESTRIL) 20 MG tablet Take 1 tablet (20 mg total) by mouth daily. (NEEDS TO BE SEEN BEFORE NEXT REFILL)  . metFORMIN (GLUCOPHAGE) 1000 MG tablet Take 1 Tablet by mouth 2 times a day with a meal  . metoprolol tartrate (LOPRESSOR) 50 MG tablet Take 1 tablet (50 mg total) by mouth 2 (two) times daily.   No facility-administered encounter medications on file as of 06/17/2020.    Patient Active Problem List   Diagnosis Date Noted  . Morbid obesity ([name]) 02/24/2015  . Depression 04/29/2014  . Peripheral edema 01/28/2014  . Hypertension 04/09/2010  . Arthritis 04/09/2010  . Diabetes mellitus ([name]) 04/09/2010  . GAD (generalized anxiety disorder) 04/09/2010  . Hyperlipidemia 04/09/2010  . Diabetic neuropathy ([name]) 04/09/2010    Conditions to be addressed/monitored:HTN and DMII  Care Plan : RNCM: Diabetes  Updates made by Gwenith Daily, RN since 06/23/2020 12:00 AM    Problem: Glycemic Management (Diabetes,  Type 2)   Priority: Medium    Long-Range Goal: Glycemic Management Optimized   Start Date: 01/28/2020  This Visit's Progress: Not on track  Recent Progress: Not on track  Priority: Medium  Note:   Objective: Lab Results  Component Value Date   HGBA1C 8.4 (H) 05/29/2019   HGBA1C 7.2 (H) 02/19/2019   HGBA1C 7.1 (H) 02/11/2018   Lab Results  Component Value Date   LDLCALC 134 (H) 05/29/2019    CREATININE 0.95 05/29/2019   Current Barriers:  . Chronic Disease Management support and education needs related to diabetes . Does not check blood sugar  . Does not adhere to a diabetic diet . Overdue for follow-up with PCP  Nurse Case Manager Clinical Goal(s):  . patient will work with PCP to address needs related to medical management diabetes . patient will meet with RN Care Manager to address self-management of diabetes . Patient will schedule routine follow-up appointment with PCP  Interventions:  . 1:1 collaboration with Bennie PieriniMartin, Mary-Margaret, FNP regarding development and update of comprehensive plan of care as evidenced by provider attestation and co-signature . Inter-disciplinary care team collaboration (see longitudinal plan of care) . Evaluation of current treatment plan related to diabetes and patient's adherence to plan as established by provider. . Chart reviewed including relevant office notes and lab results . Discussed most recent A1C of 8.2 and that it had increased from previous testing . Discussed home blood sugar testing o She has a meter but does not check her blood sugar . Reinforced need to check and record blood sugar at least 3 times a week  . Advised to call PCP with any readings outside of recommended range . Previously discussed goal of < 150 fasting and ideally between 100-130 . Reviewed medications and importance of compliance . Encouraged an ADH/carb modified diet . Encouraged increased activity level as tolerated with a goal of at least 150 minutes a week . Reinforced need to schedule follow-up with PCP  . Provided with RN Care Manager contact number and encouraged to reach out as needed  Patient Goals/Self-Care Activities Over the next 60 days, patient will: . Check and record blood sugar 3-5 times a week  . check blood sugar if I feel it is too high or too low . take the blood sugar log to all doctor visits . take the blood sugar meter to all  doctor visits  . Take medication as prescribed . [facility] a [facility] with [facility]CP [phone] . Follow a DASH diet and limit simple carbs . Call PCP if blood sugar readings are outside of recommended range (< 150 fasting with a goal of 100-130) . Call RN Care Manager as needed [phone]    Care Plan : RNCM: Quality of Life  Updates made by Gwenith DailyHudy, Hallel Denherder N, RN since 06/23/2020 12:00 AM    Problem: Quality of Life (General Plan of Care)   Priority: High    Long-Range Goal: Quality of Life Maintained   Start Date: 01/28/2020  This Visit's Progress: On track  Recent Progress: On track  Priority: High  Note:   Current Barriers:  . Unable to independently drive . Decreased vision due to glaucoma . Pain from arthritis and neuropathy  Nurse Case Manager Clinical Goal(s):  [name]. Patient will maintain current activity level or increase activity level . Patient will remain engaged with church and family for emotional, spiritual, and physical support  Interventions:  . 1:1 collaboration with Bennie PieriniMartin, Mary-Margaret, FNP regarding development  and update of comprehensive plan of care as evidenced by provider attestation and co-signature . Inter-disciplinary care team collaboration (see longitudinal plan of care) . Chart reviewed including recent office notes and lab results . Therapeutic listening utilized o Discussed glaucoma and affect of decreased vision on her ability to perform ADLs and activities that she enjoys - She enjoys baking and is still able to bake cakes that she knows the recipes by heart - Has difficulty reading recipes but does wear glasses and has a magnifier that helps . Reassessed family and social support o Family is very involved and she talks or visits with them daily o She is involved in her church and has good relationships with church members . Discussed spirituality and how that has helped her manage the stress of her chronic conditions as well as the  stress of family member's illnesses  . Reinforced need to remain as physically and socially active as possible . Encouraged patient to reach out to Lorna Few, LCSW for counseling services if interested . Again reinforced need to schedule follow-up appointment with PCP and offered to assist in scheduling o Discuss pain from peripheral neuropathy and possible medication adjustment to better manage pain . Verified that patient has RN Care Manager contact number and encouraged to reach out as needed  Patient Goals/Self-Care Activities Over the next 60 days, patient will: . Remain socially active  . Keep good relationship with friends and family . Visit or talk with friends/family daily . Stay engaged in spiritual practices/church . Talk with counselor Lorin Picket Forrest, LCSW) regarding stress related to health conditions . Remain physically active as much as possible . Do something that you enjoy daily (baking, cooking, puzzles, etc) . Keep all medical appointments . Schedule routine visit with Bennie Pierini, FNP by calling [phone]     Care Plan : RNCM: Hypertension  Updates made by Gwenith Daily, RN since 06/23/2020 12:00 AM    Problem: Hypertension (Hypertension)     Long-Range Goal: Hypertension Monitored   Start Date: 01/28/2020  This Visit's Progress: Not on track  Recent Progress: Not on track  Priority: Medium  Note:   Current Barriers:  . Chronic Disease Management support and education needs related to hypertension management . Does not check her blood pressure regularly . Overdue for f/u with PCP  Nurse Case Manager Clinical Goal(s):  . patient will work with PCP to address needs related to medical management of hypertension . patient will meet with RN Care Manager to address self-management of hypertension . Patient will schedule a routine follow-up appointment with PCP  Interventions:  . 1:1 collaboration with Bennie Pierini, FNP regarding  development and update of comprehensive plan of care as evidenced by provider attestation and co-signature . Inter-disciplinary care team collaboration (see longitudinal plan of care) . Evaluation of current treatment plan related to hypertension and patient's adherence to plan as established by provider. . Chart reviewed including relevant office notes and lab results . Reviewed and discussed medications . Discussed home blood pressure readings o Patient has a monitor but does not check blood pressure regularly o Unsure if monitor is accurate . Reinforced need to check and record blood pressure 3 times a week and as needed . Reminded to schedule follow-up appointment with PCP o Bring blood pressure monitor to visit so that it can be checked against the office machine for accuracy o Bring blood pressure log to PCP visit . Call PCP with any readings outside of recommended range .  Previously discussed diet o Does not follow a particular diet o Encouraged DASH diet . Previously discussed mobility and activity level o Enjoys baking/cooking o Has some difficulty with ambulation due to neuropathy and arthritis o Decreased vision in L eye o At risk for falls . Previously discussed family and social support o Involved in church o Strong spiritual faith o Has help from two sons and daughter-in-law as well as a non-relative caregiver . Provided with RN Care Manager contact number and encouraged to reach out as needed  Patient Goals/Self-Care Activities Over the next 60 days, patient will: . check blood pressure 3 times per week and as needed . write blood pressure results in a log or diary  . Bring blood pressure log to medical appointments . Bring blood pressure machine to next visit to check accuracy against office machine . Call PCP if blood pressure readings are outside of recommended range . DASH diet and limit simple carbohydrates and sugars . Take medications as prescribed . Call to  schedule a routine f/u with Bennie Pierini, FNP [phone] . Call RN Care Manager as needed [phone]     Follow Up Plan:  . Telephone follow up appointment with care management team member scheduled for: 07/28/20 with RN Care Manager . The patient has been provided with contact information for the care management team and has been advised to call with any health related questions or concerns.  . The patient will call [phone] as advised to schedule a routine follow-up with Bennie Pierini, FNP.  Demetrios Loll, BSN, RN-BC Embedded Chronic Care Manager [facility] Care Management Direct Dial: [phone]

## 2020-06-29 ENCOUNTER — Other Ambulatory Visit: Payer: Self-pay | Admitting: Nurse Practitioner

## 2020-06-29 DIAGNOSIS — E782 Mixed hyperlipidemia: Secondary | ICD-10-CM

## 2020-06-29 NOTE — Telephone Encounter (Signed)
MMM NTBS 30 days given 05/25/20 

## 2020-07-28 ENCOUNTER — Telehealth: Payer: Medicare HMO

## 2020-08-02 ENCOUNTER — Ambulatory Visit (INDEPENDENT_AMBULATORY_CARE_PROVIDER_SITE_OTHER): Payer: Medicare HMO | Admitting: *Deleted

## 2020-08-02 DIAGNOSIS — H401133 Primary open-angle glaucoma, bilateral, severe stage: Secondary | ICD-10-CM | POA: Insufficient documentation

## 2020-08-02 DIAGNOSIS — Z794 Long term (current) use of insulin: Secondary | ICD-10-CM

## 2020-08-02 DIAGNOSIS — E119 Type 2 diabetes mellitus without complications: Secondary | ICD-10-CM

## 2020-08-02 NOTE — Patient Instructions (Signed)
Visit Information  PATIENT GOALS:  Goals Addressed             This Visit's Progress    Matintain My Quality of Life   On track    Timeframe:  Long-Range Goal Priority:  High Start Date:                             Expected End Date:                       Follow Up Date 09/14/20   Remain socially active  Keep good relationship with friends and family Visit or talk with friends/family daily Stay engaged in spiritual practices/church Talk with counselor (Scott Forrest, LCSW) regarding stress related to health conditions Remain physically active as much as possible Do something that you enjoy daily (baking, cooking, puzzles, etc) Keep all medical appointments Repeat bloodwork for elevated potassium level   Why is this important?   Having a long-term illness can be scary.  It can also be stressful for you and your caregiver.  These steps may help.    Notes:       Monitor and Manage My Blood Sugar-Diabetes Type 2   Not on track    Timeframe:  Long-Range Goal Priority:  Medium Start Date:                             Expected End Date:                       Follow Up Date 09/14/20  Check and record blood sugar 3-5 times a week  check blood sugar if I feel it is too high or too low take the blood sugar log to all doctor visits take the blood sugar meter to all doctor visits  Take medication as prescribed Call [facility] to schedule a routine follow-up with PCP [phone] Follow a DASH diet and limit simple carbs Call PCP if blood sugar readings are outside of recommended range (< 150 fasting with a goal of 100-130) Call RN Care Manager as needed [phone]   Why is this important?   Checking your blood sugar at home helps to keep it from getting very high or very low.  Writing the results in a diary or log helps the doctor know how to care for you.  Your blood sugar log should have the time, date and the results.  Also, write down the amount of insulin or other medicine  that you take.  Other information, like what you ate, exercise done and how you were feeling, will also be helpful.     Notes:       Prevent Falls   Not on track    Timeframe:  Long-Range Goal Priority:  Medium Start Date: 08/02/20                            Expected End Date: 08/02/21                       Follow-up 09/14/20  Utilize assistive devices appropriately with all ambulation De-clutter walkways Change positions slowly Wear secure fitting shoes at all times with ambulation Utilize home lighting for dim lit areas Move carefully Keep telephone with you at all times Provide a key to sister or  hide one outside so that someone can get in to check on her if needed Call RN Care Manager as needed [phone]         Patient verbalizes understanding of instructions provided today and agrees to view in MyChart.   Telephone follow up appointment with care management team member scheduled for: 09/14/20 with RNCM  Demetrios Loll, BSN, RN-BC Embedded Chronic Care Manager [facility] Care Management Direct Dial: [phone]

## 2020-08-02 NOTE — Chronic Care Management (AMB) (Signed)
Chronic Care Management   CCM RN Visit Note  08/02/2020 Name: Kimberly Mcdowell MRN: [id number] DOB: 07-Jan-1931  Subjective: Kimberly Mcdowell is a 87 y.o. year old female who is a primary care patient of Bennie Pierini, FNP. The care management team was consulted for assistance with disease management and care coordination needs.    Engaged with patient by telephone for follow up visit in response to provider referral for case management and/or care coordination services.   Consent to Services:  The patient was given information about Chronic Care Management services, agreed to services, and gave verbal consent prior to initiation of services.  Please see initial visit note for detailed documentation.   Patient agreed to services and verbal consent obtained.   Assessment: Review of patient past medical history, allergies, medications, health status, including review of consultants reports, laboratory and other test data, was performed as part of comprehensive evaluation and provision of chronic care management services.   SDOH (Social Determinants of Health) assessments and interventions performed:    CCM Care Plan  Allergies  Allergen Reactions   Ezetimibe-Simvastatin Other (See Comments)    Muscle aches   Lipitor [Atorvastatin Calcium] Other (See Comments)    Muscle aches    Outpatient Encounter Medications as of 08/02/2020  Medication Sig   cephALEXin (KEFLEX) 500 MG capsule TAKE 1 CAPSULE BY MOUTH 2 TIMES DAILY.   ALPRAZolam (XANAX) 0.25 MG tablet Take 1 tablet (0.25 mg total) by mouth at bedtime.   amLODipine (NORVASC) 5 MG tablet Take 1 tablet (5 mg total) by mouth daily.   atorvastatin (LIPITOR) 40 MG tablet Take 1 tablet (40 mg total) by mouth daily. (NEEDS TO BE SEEN BEFORE NEXT REFILL)   azithromycin (ZITHROMAX Z-PAK) 250 MG tablet As directed   benzonatate (TESSALON PERLES) 100 MG capsule Take 1 capsule (100 mg total) by mouth 3 (three) times daily as needed.    Cholecalciferol (VITAMIN D3) 2000 UNITS TABS Take 2,000 Int'l Units by mouth daily.     citalopram (CELEXA) 40 MG tablet Take 1 tablet (40 mg total) by mouth daily.   furosemide (LASIX) 40 MG tablet Take 1 tablet (40 mg total) by mouth daily.   gabapentin (NEURONTIN) 100 MG capsule Take 1 capsule (100 mg total) by mouth at bedtime. (Needs to be seen before next refill)   glipiZIDE (GLIPIZIDE XL) 2.5 MG 24 hr tablet Take 1 tablet (2.5 mg total) by mouth daily.   glucose blood (ACCU-CHEK SMARTVIEW) test strip Test blood glucose up to three times a day   lisinopril (ZESTRIL) 20 MG tablet Take 1 tablet (20 mg total) by mouth daily. (NEEDS TO BE SEEN BEFORE NEXT REFILL)   metFORMIN (GLUCOPHAGE) 1000 MG tablet Take 1 Tablet by mouth 2 times a day with a meal   metoprolol tartrate (LOPRESSOR) 50 MG tablet Take 1 tablet (50 mg total) by mouth 2 (two) times daily.   No facility-administered encounter medications on file as of 08/02/2020.    Patient Active Problem List   Diagnosis Date Noted   Morbid obesity ([name]) 02/24/2015   Depression 04/29/2014   Peripheral edema 01/28/2014   Hypertension 04/09/2010   Arthritis 04/09/2010   Diabetes mellitus ([name]) 04/09/2010   GAD (generalized anxiety disorder) 04/09/2010   Hyperlipidemia 04/09/2010   Diabetic neuropathy ([name]) 04/09/2010    Conditions to be addressed/monitored:DMII and glaucoma and high fall risk  Care Plan : RNCM: Diabetes  Updates made by Gwenith Daily, RN since 08/02/2020 12:00 AM  Problem: Glycemic Management (Diabetes, Type 2)   Priority: Medium     Long-Range Goal: Glycemic Management Optimized   Start Date: 01/28/2020  This Visit's Progress: Not on track  Recent Progress: Not on track  Priority: Medium  Note:   Objective: Lab Results  Component Value Date   HGBA1C 8.4 (H) 05/29/2019   HGBA1C 7.2 (H) 02/19/2019   HGBA1C 7.1 (H) 02/11/2018   Lab Results  Component Value Date   LDLCALC 134 (H) 05/29/2019    CREATININE 0.95 05/29/2019  Current Barriers:  Chronic Disease Management support and education needs related to diabetes Does not check blood sugar  Does not adhere to a diabetic diet  Nurse Case Manager Clinical Goal(s):  patient will work with PCP to address needs related to medical management diabetes patient will meet with RN Care Manager to address self-management of diabetes patient will demonstrate improved adherence to prescribed treatment plan for diabetes  Interventions:  1:1 collaboration with Bennie PieriniMartin, Mary-Margaret, FNP regarding development and update of comprehensive plan of care as evidenced by provider attestation and co-signature Inter-disciplinary care team collaboration (see longitudinal plan of care) Evaluation of current treatment plan related to diabetes and patient's adherence to plan as established by provider. Chart reviewed including relevant office notes and lab results Discussed most recent A1C of 8.4 and that it had increased from previous testing Discussed home blood sugar testing She has a meter but does not check her blood sugar Reinforced need to check and record blood sugar at least 3 times a week  Advised to call PCP with any readings outside of recommended range Previously discussed goal of < 150 fasting and ideally between 100-130 Reviewed medications and importance of compliance Encouraged an ADH/carb modified diet Encouraged increased activity level as tolerated with a goal of at least 150 minutes a week Provided with RN Care Manager contact number and encouraged to reach out as needed  Patient Goals/Self-Care Activities Over the next 90 days, patient will: Check and record blood sugar 3-5 times a week  check blood sugar if I feel it is too high or too low take the blood sugar log to all doctor visits take the blood sugar meter to all doctor visits  Take medication as prescribed Schedule routine follow-ups with PCP Follow a DASH diet and limit  simple carbs Call PCP if blood sugar readings are outside of recommended range (< 150 fasting with a goal of 100-130) Call RN Care Manager as needed [phone]  Follow Up Plan:  Telephone follow up appointment with care management team member scheduled for:[facility] The patient has been provided with contact information for the care management team and has been advised to call with any health related questions or concerns.      Care Plan : RNCM: Quality of Life  Updates made by Gwenith DailyHudy, Chantelle Verdi N, RN since 08/02/2020 12:00 AM     Problem: Quality of Life (General Plan of Care)   Priority: High     Long-Range Goal: Quality of Life Maintained   Start Date: 01/28/2020  This Visit's Progress: On track  Recent Progress: On track  Priority: High  Note:   Current Barriers:  Unable to independently drive Decreased vision due to glaucoma Pain from arthritis and neuropathy  Nurse Case Manager Clinical Goal(s):  Patient will maintain current activity level or increase activity level Patient will remain engaged with church and family for emotional, spiritual, and physical support  Interventions:  1:1 collaboration with Bennie PieriniMartin, Mary-Margaret, FNP regarding development  and update of comprehensive plan of care as evidenced by provider attestation and co-signature Inter-disciplinary care team collaboration (see longitudinal plan of care) Chart reviewed including recent office notes and lab results Therapeutic listening utilized Discussed glaucoma and affect of decreased vision on her ability to perform ADLs and activities that she enjoys She enjoys baking and is still able to bake cakes that she knows the recipes by heart Has difficulty reading recipes but does wear glasses and has a magnifier that helps Reassessed family and social support Family is very involved and she talks or visits with them daily She is involved in her church and has good relationships with church  members Discussed spirituality and how that has helped her manage the stress of her chronic conditions as well as the stress of family member's illnesses  Reinforced need to remain as physically and socially active as possible Encouraged patient to reach out to [employer], LCSW for counseling services if interested Again reinforced need to schedule follow-up appointment with PCP and offered to assist in scheduling Discuss pain from peripheral neuropathy and possible medication adjustment to better manage pain Advised to repeat bloodwork from May to evaluate elevated potassium level Verified that patient has Medical illustrator contact number and encouraged to reach out as needed  Patient Goals/Self-Care Activities Over the next 60 days, patient will: Remain socially active  Keep good relationship with friends and family Visit or talk with friends/family daily Stay engaged in spiritual practices/church Talk with counselor (Scott Forrest, LCSW) regarding stress related to health conditions Remain physically active as much as possible Do something that you enjoy daily (baking, cooking, puzzles, etc) Keep all medical appointments Repeat blood work for elevated potassium level   Follow Up Plan:  Telephone follow up appointment with care management team member scheduled for: 07/28/20 with RN Care Manager The patient has been provided with contact information for the care management team and has been advised to call with any health related questions or concerns.  The patient will call [phone] as advised to schedule a routine follow-up with Bennie Pierini, FNP.         Problem: Fall Risk   Priority: Medium     Long-Range Goal: Absence of Fall and Fall-Related Injury   Start Date: 08/02/2020  This Visit's Progress: Not on track  Priority: Medium  Note:   Current Barriers:  Knowledge Deficits related to fall precautions Decreased adherence to prescribed treatment for fall  prevention Decreased vision and advanced age  Nurse Case Manager Clinical Goal(s):  Patient will demonstrate improved adherence to prescribed treatment plan for decreasing falls as evidenced by patient reporting and review of EMR Patient will verbalize using fall risk reduction strategies discussed Patient will not experience additional falls  Interventions:  Provided verbal education re: Potential causes of falls and Fall prevention strategies Reviewed medications and discussed potential side effects of medications such as dizziness and frequent urination Assessed for s/s of orthostatic hypotension Assessed for falls since last encounter One fall last week. Patient was rushing to answer the door. No major injury. Left knee is sore but getting better.  Encouraged to keep phone with her at all times Advised to move carefully and change positions slowly Discussed that [place] PD calls her daily and will come by her house to check in on her if she doesn't answer Discussed plans to provide her sister with a key to her home or hide a key outside so that someone can get in to assist her  if needed Discussed plans with patient for ongoing care management follow up and provided patient with direct contact information for care management team  Self Care Activities:  Patient verbalizes understanding of plan for fall prevention Self administers medications as prescribed Performs ADL's independently Calls provider office for new concerns or questions  Patient Goals Over the next 90 days, patient will: Utilize assistive devices appropriately with all ambulation De-clutter walkways Change positions slowly Wear secure fitting shoes at all times with ambulation Utilize home lighting for dim lit areas Move carefully Keep telephone with you at all times Provide a key to sister or hide one outside so that someone can get in to check on her if needed Call RN Care Manager as needed  [phone]  Follow Up Plan:  Telephone follow up appointment with care management team member scheduled for: 09/14/20 with RNCM The patient has been provided with contact information for the care management team and has been advised to call with any health related questions or concerns.     Plan:Telephone follow up appointment with care management team member scheduled for:  09/14/20 with RNCM   Demetrios Loll, BSN, RN-BC Embedded Chronic Care Manager [facility] / [facility] Care Management Direct Dial: [phone]

## 2020-09-01 DIAGNOSIS — H04123 Dry eye syndrome of bilateral lacrimal glands: Secondary | ICD-10-CM | POA: Diagnosis not present

## 2020-09-01 DIAGNOSIS — E119 Type 2 diabetes mellitus without complications: Secondary | ICD-10-CM | POA: Diagnosis not present

## 2020-09-01 DIAGNOSIS — H401133 Primary open-angle glaucoma, bilateral, severe stage: Secondary | ICD-10-CM | POA: Diagnosis not present

## 2020-09-01 DIAGNOSIS — Z961 Presence of intraocular lens: Secondary | ICD-10-CM | POA: Diagnosis not present

## 2020-09-14 ENCOUNTER — Telehealth: Payer: Medicare HMO | Admitting: *Deleted

## 2020-10-08 ENCOUNTER — Other Ambulatory Visit: Payer: Self-pay | Admitting: Nurse Practitioner

## 2020-11-19 ENCOUNTER — Telehealth: Payer: Medicare HMO

## 2020-11-24 ENCOUNTER — Ambulatory Visit (INDEPENDENT_AMBULATORY_CARE_PROVIDER_SITE_OTHER): Payer: Medicare HMO | Admitting: *Deleted

## 2020-11-24 DIAGNOSIS — H401133 Primary open-angle glaucoma, bilateral, severe stage: Secondary | ICD-10-CM

## 2020-11-24 DIAGNOSIS — F411 Generalized anxiety disorder: Secondary | ICD-10-CM

## 2020-11-24 DIAGNOSIS — F3342 Major depressive disorder, recurrent, in full remission: Secondary | ICD-10-CM

## 2020-12-17 ENCOUNTER — Other Ambulatory Visit: Payer: Self-pay | Admitting: Nurse Practitioner

## 2020-12-17 DIAGNOSIS — F411 Generalized anxiety disorder: Secondary | ICD-10-CM

## 2020-12-22 DIAGNOSIS — F3342 Major depressive disorder, recurrent, in full remission: Secondary | ICD-10-CM

## 2020-12-22 DIAGNOSIS — H401133 Primary open-angle glaucoma, bilateral, severe stage: Secondary | ICD-10-CM

## 2020-12-27 NOTE — Chronic Care Management (AMB) (Signed)
Chronic Care Management   CCM RN Visit Note  11/24/2020 Name: Kimberly Mcdowell MRN: [id number] DOB: 11/24/1930  Subjective: Kimberly Mcdowell is a 87 y.o. year old female who is a primary care patient of Bennie Pierini, FNP. The care management team was consulted for assistance with disease management and care coordination needs.    Engaged with patient by telephone for follow up visit in response to provider referral for case management and/or care coordination services.   Consent to Services:  The patient was given information about Chronic Care Management services, agreed to services, and gave verbal consent prior to initiation of services.  Please see initial visit note for detailed documentation.   Patient agreed to services and verbal consent obtained.   Assessment: Review of patient past medical history, allergies, medications, health status, including review of consultants reports, laboratory and other test data, was performed as part of comprehensive evaluation and provision of chronic care management services.   SDOH (Social Determinants of Health) assessments and interventions performed:    CCM Care Plan  Allergies  Allergen Reactions   Ezetimibe-Simvastatin Other (See Comments)    Muscle aches   Lipitor [Atorvastatin Calcium] Other (See Comments)    Muscle aches    Outpatient Encounter Medications as of 11/24/2020  Medication Sig   cephALEXin (KEFLEX) 500 MG capsule TAKE 1 CAPSULE BY MOUTH 2 TIMES DAILY.   ALPRAZolam (XANAX) 0.25 MG tablet Take 1 tablet (0.25 mg total) by mouth at bedtime.   amLODipine (NORVASC) 5 MG tablet Take 1 tablet (5 mg total) by mouth daily.   atorvastatin (LIPITOR) 40 MG tablet Take 1 tablet (40 mg total) by mouth daily. (NEEDS TO BE SEEN BEFORE NEXT REFILL)   azithromycin (ZITHROMAX Z-PAK) 250 MG tablet As directed   benzonatate (TESSALON PERLES) 100 MG capsule Take 1 capsule (100 mg total) by mouth 3 (three) times daily as needed.    Cholecalciferol (VITAMIN D3) 2000 UNITS TABS Take 2,000 Int'l Units by mouth daily.     citalopram (CELEXA) 40 MG tablet Take 1 tablet (40 mg total) by mouth daily.   furosemide (LASIX) 40 MG tablet Take 1 tablet (40 mg total) by mouth daily.   gabapentin (NEURONTIN) 100 MG capsule Take 1 capsule (100 mg total) by mouth at bedtime. (Needs to be seen before next refill)   glipiZIDE (GLIPIZIDE XL) 2.5 MG 24 hr tablet Take 1 tablet (2.5 mg total) by mouth daily.   glucose blood (ACCU-CHEK SMARTVIEW) test strip Test blood glucose up to three times a day   lisinopril (ZESTRIL) 20 MG tablet Take 1 tablet (20 mg total) by mouth daily. (NEEDS TO BE SEEN BEFORE NEXT REFILL)   metFORMIN (GLUCOPHAGE) 1000 MG tablet Take 1 Tablet by mouth 2 times a day with a meal   metoprolol tartrate (LOPRESSOR) 50 MG tablet Take 1 tablet (50 mg total) by mouth 2 (two) times daily.   No facility-administered encounter medications on file as of 11/24/2020.    Patient Active Problem List   Diagnosis Date Noted   Primary open angle glaucoma of both eyes, severe stage 08/02/2020   Morbid obesity ([name]) 02/24/2015   Depression 04/29/2014   Peripheral edema 01/28/2014   Hypertension 04/09/2010   Arthritis 04/09/2010   Diabetes mellitus ([name]) 04/09/2010   GAD (generalized anxiety disorder) 04/09/2010   Hyperlipidemia 04/09/2010   Diabetic neuropathy ([name]) 04/09/2010    Conditions to be addressed/monitored:glaucoma, arthritis, depression, anxity  Care Plan : RNCM: Quality of Life  Problem: Quality of Life (General Plan of Care)   Priority: High     Long-Range Goal: Quality of Life Maintained   Start Date: 01/28/2020  This Visit's Progress: On track  Recent Progress: On track  Priority: High  Note:   Current Barriers:  Unable to independently drive Decreased vision due to glaucoma Pain from arthritis and neuropathy  Nurse Case Manager Clinical Goal(s):  Patient will maintain current activity level or  increase activity level Patient will remain engaged with church and family for emotional, spiritual, and physical support  Interventions:  1:1 collaboration with Bennie Pierini, FNP regarding development and update of comprehensive plan of care as evidenced by provider attestation and co-signature Inter-disciplinary care team collaboration (see longitudinal plan of care) Chart reviewed including recent office notes and lab results Therapeutic listening utilized regarding lifestyle changes and modifications due to medical conditions Reassessed family and social support Family is very involved and she talks or visits with them daily She is involved in her church and has good relationships with church members Discussed spirituality and how that has helped her manage the stress of her chronic conditions as well as the stress of family member's illnesses  Reinforced need to remain as physically and socially active as possible Encouraged patient to reach out to [employer], LCSW for counseling services if interested Again reinforced need to schedule follow-up appointment with PCP and offered to assist in scheduling Verified that patient has RN Care Manager contact number and encouraged to reach out as needed     Problem: Fall Risk   Priority: Medium     Long-Range Goal: Absence of Fall and Fall-Related Injury   Start Date: 08/02/2020  This Visit's Progress: On track  Recent Progress: Not on track  Priority: Medium  Note:   Current Barriers:  Knowledge Deficits related to fall precautions Decreased adherence to prescribed treatment for fall prevention Decreased vision and advanced age  Nurse Case Manager Clinical Goal(s):  Patient will demonstrate improved adherence to prescribed treatment plan for decreasing falls as evidenced by patient reporting and review of EMR Patient will verbalize using fall risk reduction strategies discussed Patient will not experience additional  falls  Interventions:  Assessed patient's understanding of previously provided fall prevention strategies  Reviewed medications and discussed potential side effects of medications such as dizziness and frequent urination Assessed for s/s of orthostatic hypotension Assessed for falls since last encounter Encouraged to keep phone with her at all times Advised to move carefully and change positions slowly Discussed that [place] PD calls her daily and will come by her house to check in on her if she doesn't answer Discussed plans to provide her sister with a key to her home or hide a key outside so that someone can get in to assist her if needed Discussed plans with patient for ongoing care management follow up and provided patient with direct contact information for care management team  Self Care Activities:  Patient verbalizes understanding of plan for fall prevention Self administers medications as prescribed Performs ADL's independently Calls provider office for new concerns or questions  Plan:Telephone follow up appointment with care management team member scheduled for:  01/11/2021 with [facility] The patient has been provided with contact information for the care management team and has been advised to call with any health related questions or concerns.   Demetrios Loll, BSN, RN-BC Embedded Chronic Care Manager [facility] / [facility] Care Management Direct Dial: [phone]

## 2020-12-27 NOTE — Patient Instructions (Signed)
Visit Information  Self Care Activities:  Patient verbalizes understanding of plan for fall prevention Self administers medications as prescribed Performs ADL's independently Calls provider office for new concerns or questions  Patient verbalizes understanding of instructions provided today and agrees to view in MyChart.   Plan:Telephone follow up appointment with care management team member scheduled for:  01/11/2021 with [facility] The patient has been provided with contact information for the care management team and has been advised to call with any health related questions or concerns.   Demetrios Loll, BSN, RN-BC Embedded Chronic Care Manager [facility] / [facility] Care Management Direct Dial: [phone]

## 2021-01-11 ENCOUNTER — Ambulatory Visit (INDEPENDENT_AMBULATORY_CARE_PROVIDER_SITE_OTHER): Payer: Medicare HMO | Admitting: *Deleted

## 2021-01-11 DIAGNOSIS — I10 Essential (primary) hypertension: Secondary | ICD-10-CM

## 2021-01-11 DIAGNOSIS — H401133 Primary open-angle glaucoma, bilateral, severe stage: Secondary | ICD-10-CM

## 2021-01-11 DIAGNOSIS — E119 Type 2 diabetes mellitus without complications: Secondary | ICD-10-CM

## 2021-01-12 NOTE — Chronic Care Management (AMB) (Signed)
Chronic Care Management   CCM RN Visit Note  01/11/2021 Name: Kimberly Mcdowell MRN: [id number] DOB: October 22, 1930  Subjective: Kimberly Mcdowell is a 87 y.o. year old female who is a primary care patient of Bennie Pierini, FNP. The care management team was consulted for assistance with disease management and care coordination needs.    Engaged with patient by telephone for follow up visit in response to provider referral for case management and/or care coordination services.   Consent to Services:  The patient was given information about Chronic Care Management services, agreed to services, and gave verbal consent prior to initiation of services.  Please see initial visit note for detailed documentation.   Patient agreed to services and verbal consent obtained.   Assessment: Review of patient past medical history, allergies, medications, health status, including review of consultants reports, laboratory and other test data, was performed as part of comprehensive evaluation and provision of chronic care management services.   SDOH (Social Determinants of Health) assessments and interventions performed:    CCM Care Plan  Allergies  Allergen Reactions   Ezetimibe-Simvastatin Other (See Comments)    Muscle aches   Lipitor [Atorvastatin Calcium] Other (See Comments)    Muscle aches    Outpatient Encounter Medications as of 01/11/2021  Medication Sig   cephALEXin (KEFLEX) 500 MG capsule TAKE 1 CAPSULE BY MOUTH 2 TIMES DAILY.   ALPRAZolam (XANAX) 0.25 MG tablet Take 1 tablet (0.25 mg total) by mouth at bedtime.   amLODipine (NORVASC) 5 MG tablet Take 1 tablet (5 mg total) by mouth daily.   atorvastatin (LIPITOR) 40 MG tablet Take 1 tablet (40 mg total) by mouth daily. (NEEDS TO BE SEEN BEFORE NEXT REFILL)   azithromycin (ZITHROMAX Z-PAK) 250 MG tablet As directed   benzonatate (TESSALON PERLES) 100 MG capsule Take 1 capsule (100 mg total) by mouth 3 (three) times daily as needed.    Cholecalciferol (VITAMIN D3) 2000 UNITS TABS Take 2,000 Int'l Units by mouth daily.     citalopram (CELEXA) 40 MG tablet Take 1 tablet (40 mg total) by mouth daily.   furosemide (LASIX) 40 MG tablet Take 1 tablet (40 mg total) by mouth daily.   gabapentin (NEURONTIN) 100 MG capsule Take 1 capsule (100 mg total) by mouth at bedtime. (Needs to be seen before next refill)   glipiZIDE (GLIPIZIDE XL) 2.5 MG 24 hr tablet Take 1 tablet (2.5 mg total) by mouth daily.   glucose blood (ACCU-CHEK SMARTVIEW) test strip Test blood glucose up to three times a day   lisinopril (ZESTRIL) 20 MG tablet Take 1 tablet (20 mg total) by mouth daily. (NEEDS TO BE SEEN BEFORE NEXT REFILL)   metFORMIN (GLUCOPHAGE) 1000 MG tablet Take 1 Tablet by mouth 2 times a day with a meal   metoprolol tartrate (LOPRESSOR) 50 MG tablet Take 1 tablet (50 mg total) by mouth 2 (two) times daily.   No facility-administered encounter medications on file as of 01/11/2021.    Patient Active Problem List   Diagnosis Date Noted   Primary open angle glaucoma of both eyes, severe stage 08/02/2020   Morbid obesity ([name]) 02/24/2015   Depression 04/29/2014   Peripheral edema 01/28/2014   Hypertension 04/09/2010   Arthritis 04/09/2010   Diabetes mellitus ([name]) 04/09/2010   GAD (generalized anxiety disorder) 04/09/2010   Hyperlipidemia 04/09/2010   Diabetic neuropathy ([name]) 04/09/2010    Conditions to be addressed/monitored:HTN, DMII, and glaucoma  Care Plan : [facility] Care Plan     Problem:  Chronic Disease Management Needs   Priority: High  Onset Date: 01/11/2021     Long-Range Goal: Patient will Work with RN Care Manager Regarding Care Management and Care Coordination Associated with Diabetes, Hypertension, and Glaucoma   Start Date: 01/11/2021  Expected End Date: 01/11/2022  This Visit's Progress: On track  Priority: High  Note:   Current Barriers:  Chronic Disease Management support and education needs related to HTN, DMII,  and Glaucoma (severe stage) Non-adherence to scheduled provider appointments Non-adherence to prescribed medication regimen Limited vision  RNCM Clinical Goal(s):  Patient will continue to work with RN Care Manager and/or Social Worker to address care management and care coordination needs related to HTN, DMII, and Glaucoma as evidenced by adherence to CM Team Scheduled appointments     through collaboration with RN Care manager, provider, and care team.   Interventions: 1:1 collaboration with primary care provider regarding development and update of comprehensive plan of care as evidenced by provider attestation and co-signature Inter-disciplinary care team collaboration (see longitudinal plan of care) Evaluation of current treatment plan related to  self management and patient's adherence to plan as established by provider   Diabetes:  (Status: Goal on track: NO.) Long Term Goal   Lab Results  Component Value Date   HGBA1C 8.4 (H) 05/29/2019  Assessed patient's understanding of A1c goal: <8% Reviewed and discussed most recent office notes and lab reports Counseled on importance of routine follow-ups with PCP and regular laboratory monitoring. Patient verbalized understanding but is not interested in scheduling a follow-up at this time.  Reviewed and discussed medications and refill history. Patient is not taking medications as prescribed.   Glaucoma:  (Status: Goal on track: NO.) Long Term Goal  Discussed progression of disease and prognosis Encouraged to continue to follow-up with ophthalmologist as recommended Discussed family/social support. Has great support from two sons, daughter-in-laws, friends, and extended family. She also has someone that comes in and helps with cleaning, bill paying, ADLs, etc Discussed limitations due to decreased vision Discussed quality of life and life goals. Mainly focused on spending quality time with friends and family now. Not very concerned with  managing her chronic conditions. Stated multiple times that she has a strong [place] faith and is ready "to go" when it's her time. Discussed how her decreased vision is affecting her overall life and mental health Encouraged to stay active with friends and family and to socialize often Encouraged to reach out to LCSW to discuss psychosocial concerns Discussed mobility and fall precautions    Hypertension: (Status: Goal on track: NO.) Last practice recorded BP readings:  BP Readings from Last 3 Encounters:  05/29/19 (!) 187/92  02/19/19 (!) 144/88  02/11/18 139/84  Reviewed and discussed most recent office notes Recommended routine follow-up and laboratory monitoring Reviewed and discussed medications and refill history Patient is not taking medications routinely Discussed personal goals. Patient is not interested in scheduling a PCP follow-up at this time.   Patient Goals/Self-Care Activities: Attend church or other social activities Call provider office for new concerns or questions  Call RN Care Manager as needed [phone] Talk with LCSW as needed Talk with family/friends daily Move carefully to avoid falls Keep phone or medical alert system with you at all times Consider scheduling a routine follow-up with PCP for management of chronic diseases Keep follow-up with ophthalmologist   Plan:Telephone follow up appointment with care management team member scheduled for:  02/17/21 with RNCM The patient has been provided with contact information for  the care management team and has been advised to call with any health related questions or concerns.   Demetrios Loll, BSN, RN-BC Embedded Chronic Care Manager [facility] / [facility] Care Management Direct Dial: [phone]

## 2021-01-12 NOTE — Patient Instructions (Signed)
Visit Information   Patient Goals/Self-Care Activities: Attend church or other social activities Call provider office for new concerns or questions  Call RN Care Manager as needed [phone] Talk with LCSW as needed Talk with family/friends daily Move carefully to avoid falls Keep phone or medical alert system with you at all times Consider scheduling a routine follow-up with PCP for management of chronic diseases Keep follow-up with ophthalmologist  Patient verbalizes understanding of instructions provided today and agrees to view in MyChart.   Plan:Telephone follow up appointment with care management team member scheduled for:  02/17/21 with RNCM The patient has been provided with contact information for the care management team and has been advised to call with any health related questions or concerns.   Demetrios Loll, BSN, RN-BC Embedded Chronic Care Manager [facility] / [facility] Care Management Direct Dial: [phone]

## 2021-01-22 DIAGNOSIS — I10 Essential (primary) hypertension: Secondary | ICD-10-CM

## 2021-01-22 DIAGNOSIS — Z794 Long term (current) use of insulin: Secondary | ICD-10-CM

## 2021-01-22 DIAGNOSIS — E119 Type 2 diabetes mellitus without complications: Secondary | ICD-10-CM | POA: Diagnosis not present

## 2021-01-22 DIAGNOSIS — H401133 Primary open-angle glaucoma, bilateral, severe stage: Secondary | ICD-10-CM

## 2021-02-07 ENCOUNTER — Ambulatory Visit (INDEPENDENT_AMBULATORY_CARE_PROVIDER_SITE_OTHER): Payer: Medicare HMO

## 2021-02-07 VITALS — Ht 60.5 in | Wt 220.0 lb

## 2021-02-07 DIAGNOSIS — E119 Type 2 diabetes mellitus without complications: Secondary | ICD-10-CM

## 2021-02-07 DIAGNOSIS — Z0001 Encounter for general adult medical examination with abnormal findings: Secondary | ICD-10-CM | POA: Diagnosis not present

## 2021-02-07 DIAGNOSIS — Z794 Long term (current) use of insulin: Secondary | ICD-10-CM | POA: Diagnosis not present

## 2021-02-07 DIAGNOSIS — H409 Unspecified glaucoma: Secondary | ICD-10-CM

## 2021-02-07 DIAGNOSIS — Z599 Problem related to housing and economic circumstances, unspecified: Secondary | ICD-10-CM | POA: Diagnosis not present

## 2021-02-07 DIAGNOSIS — Z0000 Encounter for general adult medical examination without abnormal findings: Secondary | ICD-10-CM

## 2021-02-07 NOTE — Patient Instructions (Signed)
Kimberly Mcdowell , Thank you for taking time to come for your Medicare Wellness Visit. I appreciate your ongoing commitment to your health goals. Please review the following plan we discussed and let me know if I can assist you in the future.   Screening recommendations/referrals: Colonoscopy: No longer required due to age.  Mammogram: No longer required due to age. Bone Density: Done 03/25/2008.   Recommended yearly ophthalmology/optometry visit for glaucoma screening and checkup Recommended yearly dental visit for hygiene and checkup  Vaccinations: Influenza vaccine: Declined  Pneumococcal vaccine: Done 04/29/2014 and 09/23/2010 Tdap vaccine: Done 09/23/2010 Repeat in 10 years  Shingles vaccine: Due    Covid-19:Done 08/05/2019 and 09/15/2019  Advanced directives: Advance directive discussed with you today. Even though you declined this today, please call our office should you change your mind, and we can give you the proper paperwork for you to fill out.   Conditions/risks identified: Aim for 30 minutes of exercise or brisk walking each day, drink 6-8 glasses of water and eat lots of fruits and vegetables.   Next appointment: Follow up in one year for your annual wellness visit 2024.   Preventive Care 86 Years and Older, Female Preventive care refers to lifestyle choices and visits with your health care provider that can promote health and wellness. What does preventive care include? A yearly physical exam. This is also called an annual well check. Dental exams once or twice a year. Routine eye exams. Ask your health care provider how often you should have your eyes checked. Personal lifestyle choices, including: Daily care of your teeth and gums. Regular physical activity. Eating a healthy diet. Avoiding tobacco and drug use. Limiting alcohol use. Practicing safe sex. Taking low-dose aspirin every day. Taking vitamin and mineral supplements as recommended by your health care  provider. What happens during an annual well check? The services and screenings done by your health care provider during your annual well check will depend on your age, overall health, lifestyle risk factors, and family history of disease. Counseling  Your health care provider may ask you questions about your: Alcohol use. Tobacco use. Drug use. Emotional well-being. Home and relationship well-being. Sexual activity. Eating habits. History of falls. Memory and ability to understand (cognition). Work and work Astronomer. Reproductive health. Screening  You may have the following tests or measurements: Height, weight, and BMI. Blood pressure. Lipid and cholesterol levels. These may be checked every 5 years, or more frequently if you are over 68 years old. Skin check. Lung cancer screening. You may have this screening every year starting at age 86 if you have a 30-pack-year history of smoking and currently smoke or have quit within the past 15 years. Fecal occult blood test (FOBT) of the stool. You may have this test every year starting at age 86. Flexible sigmoidoscopy or colonoscopy. You may have a sigmoidoscopy every 5 years or a colonoscopy every 10 years starting at age 86. Hepatitis C blood test. Hepatitis B blood test. Sexually transmitted disease (STD) testing. Diabetes screening. This is done by checking your blood sugar (glucose) after you have not eaten for a while (fasting). You may have this done every 1-3 years. Bone density scan. This is done to screen for osteoporosis. You may have this done starting at age 72. Mammogram. This may be done every 1-2 years. Talk to your health care provider about how often you should have regular mammograms. Talk with your health care provider about your test results, treatment options, and if necessary,  the need for more tests. Vaccines  Your health care provider may recommend certain vaccines, such as: Influenza vaccine. This is  recommended every year. Tetanus, diphtheria, and acellular pertussis (Tdap, Td) vaccine. You may need a Td booster every 10 years. Zoster vaccine. You may need this after age 86. Pneumococcal 13-valent conjugate (PCV13) vaccine. One dose is recommended after age 86. Pneumococcal polysaccharide (PPSV23) vaccine. One dose is recommended after age 86. Talk to your health care provider about which screenings and vaccines you need and how often you need them. This information is not intended to replace advice given to you by your health care provider. Make sure you discuss any questions you have with your health care provider. Document Released: 02/05/2015 Document Revised: 09/29/2015 Document Reviewed: 11/10/2014 Elsevier Interactive Patient Education  2017 Colon Prevention in the Home Falls can cause injuries. They can happen to people of all ages. There are many things you can do to make your home safe and to help prevent falls. What can I do on the outside of my home? Regularly fix the edges of walkways and driveways and fix any cracks. Remove anything that might make you trip as you walk through a door, such as a raised step or threshold. Trim any bushes or trees on the path to your home. Use bright outdoor lighting. Clear any walking paths of anything that might make someone trip, such as rocks or tools. Regularly check to see if handrails are loose or broken. Make sure that both sides of any steps have handrails. Any raised decks and porches should have guardrails on the edges. Have any leaves, snow, or ice cleared regularly. Use sand or salt on walking paths during winter. Clean up any spills in your garage right away. This includes oil or grease spills. What can I do in the bathroom? Use night lights. Install grab bars by the toilet and in the tub and shower. Do not use towel bars as grab bars. Use non-skid mats or decals in the tub or shower. If you need to sit down in  the shower, use a plastic, non-slip stool. Keep the floor dry. Clean up any water that spills on the floor as soon as it happens. Remove soap buildup in the tub or shower regularly. Attach bath mats securely with double-sided non-slip rug tape. Do not have throw rugs and other things on the floor that can make you trip. What can I do in the bedroom? Use night lights. Make sure that you have a light by your bed that is easy to reach. Do not use any sheets or blankets that are too big for your bed. They should not hang down onto the floor. Have a firm chair that has side arms. You can use this for support while you get dressed. Do not have throw rugs and other things on the floor that can make you trip. What can I do in the kitchen? Clean up any spills right away. Avoid walking on wet floors. Keep items that you use a lot in easy-to-reach places. If you need to reach something above you, use a strong step stool that has a grab bar. Keep electrical cords out of the way. Do not use floor polish or wax that makes floors slippery. If you must use wax, use non-skid floor wax. Do not have throw rugs and other things on the floor that can make you trip. What can I do with my stairs? Do not leave any items on  the stairs. Make sure that there are handrails on both sides of the stairs and use them. Fix handrails that are broken or loose. Make sure that handrails are as long as the stairways. Check any carpeting to make sure that it is firmly attached to the stairs. Fix any carpet that is loose or worn. Avoid having throw rugs at the top or bottom of the stairs. If you do have throw rugs, attach them to the floor with carpet tape. Make sure that you have a light switch at the top of the stairs and the bottom of the stairs. If you do not have them, ask someone to add them for you. What else can I do to help prevent falls? Wear shoes that: Do not have high heels. Have rubber bottoms. Are comfortable  and fit you well. Are closed at the toe. Do not wear sandals. If you use a stepladder: Make sure that it is fully opened. Do not climb a closed stepladder. Make sure that both sides of the stepladder are locked into place. Ask someone to hold it for you, if possible. Clearly mark and make sure that you can see: Any grab bars or handrails. First and last steps. Where the edge of each step is. Use tools that help you move around (mobility aids) if they are needed. These include: Canes. Walkers. Scooters. Crutches. Turn on the lights when you go into a dark area. Replace any light bulbs as soon as they burn out. Set up your furniture so you have a clear path. Avoid moving your furniture around. If any of your floors are uneven, fix them. If there are any pets around you, be aware of where they are. Review your medicines with your doctor. Some medicines can make you feel dizzy. This can increase your chance of falling. Ask your doctor what other things that you can do to help prevent falls. This information is not intended to replace advice given to you by your health care provider. Make sure you discuss any questions you have with your health care provider. Document Released: 11/05/2008 Document Revised: 06/17/2015 Document Reviewed: 02/13/2014 Elsevier Interactive Patient Education  2017 [employer].

## 2021-02-07 NOTE — Progress Notes (Signed)
Subjective:   Kimberly Mcdowell is a 88 y.o. female who presents for Medicare Annual (Subsequent) preventive examination. Virtual Visit via Telephone Note  I connected with  Kimberly Mcdowell on 02/07/21 at  9:00 AM EST by telephone and verified that I am speaking with the correct person using two identifiers.  Location: Patient: HOME Provider: [facility] Persons participating in the virtual visit: patient/Nurse Health Advisor   I discussed the limitations, risks, security and privacy concerns of performing an evaluation and management service by telephone and the availability of in person appointments. The patient expressed understanding and agreed to proceed.  Interactive audio and video telecommunications were attempted between this nurse and patient, however failed, due to patient having technical difficulties OR patient did not have access to video capability.  We continued and completed visit with audio only.  Some vital signs may be absent or patient reported.   Darral Dash, LPN  Review of Systems     Cardiac Risk Factors include: advanced age (>28men, >64 women);hypertension;diabetes mellitus;dyslipidemia;obesity (BMI >30kg/m2);sedentary lifestyle  PHONE VISIT. PT AT HOME. NURSE AT [facility]    Objective:    Today's Vitals   02/07/21 0858  Weight: 220 lb (99.8 kg)  Height: 5' 0.5" (1.537 m)   Body mass index is 42.26 kg/m.  Advanced Directives 02/07/2021 05/20/2019 01/28/2014  Does Patient Have a Medical Advance Directive? No No Yes  Copy of Healthcare Power of Attorney in Chart? - - No - copy requested  Would patient like information on creating a medical advance directive? No - Patient declined Yes (MAU/Ambulatory/Procedural Areas - Information given) -    Current Medications (verified) Outpatient Encounter Medications as of 02/07/2021  Medication Sig   ALPRAZolam (XANAX) 0.25 MG tablet Take 1 tablet (0.25 mg total) by mouth at bedtime.   amLODipine (NORVASC) 5 MG  tablet Take 1 tablet (5 mg total) by mouth daily.   atorvastatin (LIPITOR) 40 MG tablet Take 1 tablet (40 mg total) by mouth daily. (NEEDS TO BE SEEN BEFORE NEXT REFILL)   azithromycin (ZITHROMAX Z-PAK) 250 MG tablet As directed   benzonatate (TESSALON PERLES) 100 MG capsule Take 1 capsule (100 mg total) by mouth 3 (three) times daily as needed.   brimonidine (ALPHAGAN) 0.2 % ophthalmic solution 1 drop 2 (two) times daily.   cephALEXin (KEFLEX) 500 MG capsule TAKE 1 CAPSULE BY MOUTH 2 TIMES DAILY.   Cholecalciferol (VITAMIN D3) 2000 UNITS TABS Take 2,000 Int'l Units by mouth daily.     citalopram (CELEXA) 40 MG tablet Take 1 tablet (40 mg total) by mouth daily.   dorzolamide-timolol (COSOPT) 22.3-6.8 MG/ML ophthalmic solution 1 drop 2 (two) times daily.   furosemide (LASIX) 40 MG tablet Take 1 tablet (40 mg total) by mouth daily.   gabapentin (NEURONTIN) 100 MG capsule Take 1 capsule (100 mg total) by mouth at bedtime. (Needs to be seen before next refill)   glipiZIDE (GLIPIZIDE XL) 2.5 MG 24 hr tablet Take 1 tablet (2.5 mg total) by mouth daily.   glucose blood (ACCU-CHEK SMARTVIEW) test strip Test blood glucose up to three times a day   latanoprost (XALATAN) 0.005 % ophthalmic solution 1 drop at bedtime.   lisinopril (ZESTRIL) 20 MG tablet Take 1 tablet (20 mg total) by mouth daily. (NEEDS TO BE SEEN BEFORE NEXT REFILL)   metFORMIN (GLUCOPHAGE) 1000 MG tablet Take 1 Tablet by mouth 2 times a day with a meal   metoprolol tartrate (LOPRESSOR) 50 MG tablet Take 1 tablet (50  mg total) by mouth 2 (two) times daily.   No facility-administered encounter medications on file as of 02/07/2021.    Allergies (verified) Ezetimibe-simvastatin and Lipitor [atorvastatin calcium]   History: Past Medical History:  Diagnosis Date   Arthritis    Diabetes mellitus without complication ([name])    Diabetic neuropathy ([name])    Glaucoma    Hyperlipidemia    Hypertension    Hypokalemia    Osteoporosis     Past Surgical History:  Procedure Laterality Date   ABDOMINAL HYSTERECTOMY     APPENDECTOMY     CHOLECYSTECTOMY     FRACTURE SURGERY     Family History  Problem Relation Age of Onset   Stroke Mother    Arthritis Father    Arthritis Sister    Ovarian cancer Sister    Cancer Sister    Social History   Socioeconomic History   Marital status: Widowed    Spouse name: Not on file   Number of children: 2   Years of education: 7312   Highest education level: Not on file  Occupational History   Not on file  Tobacco Use   Smoking status: Former    Packs/day: 3.00    Years: 15.00    Pack years: 45.00    Types: Cigarettes   Smokeless tobacco: Never  Vaping Use   Vaping Use: Never used  Substance and Sexual Activity   Alcohol use: No   Drug use: No   Sexual activity: Not Currently  Other Topics Concern   Not on file  Social History Narrative   Widowed x 12 years.   Family lives close by 2 sons, 2 daughter in laws.   Social Determinants of Health   Financial Resource Strain: Low Risk    Difficulty of Paying Living Expenses: Not hard at all  Food Insecurity: No Food Insecurity   Worried About Programme researcher, broadcasting/film/videounning Out of Food in the Last Year: Never true   Ran Out of Food in the Last Year: Never true  Transportation Needs: No Transportation Needs   Lack of Transportation (Medical): No   Lack of Transportation (Non-Medical): No  Physical Activity: Insufficiently Active   Days of Exercise per Week: 3 days   Minutes of Exercise per Session: 20 min  Stress: No Stress Concern Present   Feeling of Stress : Not at all  Social Connections: Moderately Integrated   Frequency of Communication with Friends and Family: More than three times a week   Frequency of Social Gatherings with Friends and Family: More than three times a week   Attends Religious Services: More than 4 times per year   Active Member of [employer] or Organizations: Yes   Attends BankerClub or Organization Meetings: More than 4 times  per year   Marital Status: Widowed    Tobacco Counseling Counseling given: Not Answered   Clinical Intake:  Pre-visit preparation completed: Yes  Pain : No/denies pain     BMI - recorded: 42.26 Nutritional Status: BMI > 30  Obese Nutritional Risks: None Diabetes: Yes  How often do you need to have someone help you when you read instructions, pamphlets, or other written materials from your doctor or pharmacy?: 1 - Never  Diabetic?Nutrition Risk Assessment:  Has the patient had any N/V/D within the last 2 months?  No  Does the patient have any non-healing wounds?  No  Has the patient had any unintentional weight loss or weight gain?  No   Diabetes:  Is the patient diabetic?  Yes  If diabetic, was a CBG obtained today?  No  Did the patient bring in their glucometer from home?  No  Phone visit. How often do you monitor your CBG's? Irregularly.   Financial Strains and Diabetes Management:  Are you having any financial strains with the device, your supplies or your medication? No .  Does the patient want to be seen by Chronic Care Management for management of their diabetes?  No  Would the patient like to be referred to a Nutritionist or for Diabetic Management?  No   Diabetic Exams:  Diabetic Eye Exam: Completed 2022.  Pt has been advised about the importance in completing this exam.  Diabetic Foot Exam: Completed 05/23/2018. Pt has been advised about the importance in completing this exam. Interpreter Needed?: No  Information entered by :: MJ Kingson Lohmeyer, LPN   Activities of Daily Living In your present state of health, do you have any difficulty performing the following activities: 02/07/2021  Hearing? N  Vision? Y  Difficulty concentrating or making decisions? N  Walking or climbing stairs? Y  Comment Due to impaired vision.  Dressing or bathing? N  Doing errands, shopping? N  Preparing Food and eating ? N  Using the Toilet? N  In the past six months, have you  accidently leaked urine? N  Do you have problems with loss of bowel control? N  Managing your Medications? N  Managing your Finances? N  Housekeeping or managing your Housekeeping? Y  Comment Due to her vision issues.  Some recent data might be hidden    Patient Care Team: Bennie PieriniMartin, Addylynn Balin-Margaret, FNP as PCP - General (Nurse Practitioner) Gwenith DailyHudy, Kristen N, RN as Case Manager Sallye LatGroat, Christopher, MD as Consulting Physician (Ophthalmology)  Indicate any recent Medical Services you may have received from other than [facility] providers in the past year (date may be approximate).     Assessment:   This is a routine wellness examination for Kimberly Mcdowell.  Hearing/Vision screen Hearing Screening - Comments:: No hearing issues.  Vision Screening - Comments:: Glasses. Dr. Dione BoozeGroat. 2022. Glaucoma to both eyes.   Dietary issues and exercise activities discussed: Current Exercise Habits: The patient does not participate in regular exercise at present, Exercise limited by: cardiac condition(s);neurologic condition(s)   Goals Addressed             This Visit's Progress    [place] My Quality of Life   On track    Timeframe:  Long-Range Goal Priority:  High Start Date:                             Expected End Date:                       Follow Up Date 09/14/20   Remain socially active  Keep good relationship with friends and family Visit or talk with friends/family daily Stay engaged in spiritual practices/church Talk with counselor (Scott Forrest, LCSW) regarding stress related to health conditions Remain physically active as much as possible Do something that you enjoy daily (baking, cooking, puzzles, etc) Keep all medical appointments Repeat bloodwork for elevated potassium level   Why is this important?   Having a long-term illness can be scary.  It can also be stressful for you and your caregiver.  These steps may help.    Notes:      Prevent Falls   On track    Timeframe:  Long-Range Goal Priority:  Medium Start Date: 08/02/20                            Expected End Date: 08/02/21                       Follow-up 09/14/20  Utilize assistive devices appropriately with all ambulation De-clutter walkways Change positions slowly Wear secure fitting shoes at all times with ambulation Utilize home lighting for dim lit areas Move carefully Keep telephone with you at all times Provide a key to sister or hide one outside so that someone can get in to check on her if needed Call RN Care Manager as needed [phone]       Depression Screen PHQ 2/9 Scores 02/07/2021 05/20/2019 02/19/2019 06/08/2018 05/23/2018 02/11/2018 09/05/2017  PHQ - 2 Score 1 1 0 0 0 0 0  PHQ- 9 Score - - - - - - -    Fall Risk Fall Risk  02/07/2021 05/29/2019 05/20/2019 02/19/2019 02/11/2018  Falls in the past year? 0 1 1 1 1   Number falls in past yr: 0 1 0 1 1  Injury with Fall? 0 1 1 0 1  Comment - - - - hand pain, cuts and bruises  Risk for fall due to : Impaired balance/gait;Impaired vision;Impaired mobility - History of fall(s);Impaired mobility - -  Risk for fall due to: Comment - - - - -  Follow up Falls prevention discussed Falls prevention discussed Falls prevention discussed - -    FALL RISK PREVENTION PERTAINING TO THE HOME:  Any stairs in or around the home? No  If so, are there any without handrails? No  Home free of loose throw rugs in walkways, pet beds, electrical cords, etc? Yes  Adequate lighting in your home to reduce risk of falls? Yes   ASSISTIVE DEVICES UTILIZED TO PREVENT FALLS:  Life alert? No  Use of a cane, walker or w/c? Yes  Grab bars in the bathroom? Yes  Shower chair or bench in shower? Yes  Elevated toilet seat or a handicapped toilet? Yes   TIMED UP AND GO:  Was the test performed? No .  Phone visit  Cognitive Function:     6CIT Screen 05/20/2019  What Year? 4 points  What month? 3 points  What time? 0 points  Count back from 20 2 points  Months  in reverse 2 points  Repeat phrase 0 points  Total Score 11    Immunizations Immunization History  Administered Date(s) Administered   Influenza, High Dose Seasonal PF 11/09/2016   Influenza,inj,Quad PF,6+ Mos 01/28/2014   Moderna Sars-Covid-2 Vaccination 08/05/2019, 09/15/2019   Pneumococcal Conjugate-13 04/29/2014   Pneumococcal Polysaccharide-23 09/23/2010   Tdap 09/23/2010    TDAP status: Due, Education has been provided regarding the importance of this vaccine. Advised may receive this vaccine at local pharmacy or Health Dept. Aware to provide a copy of the vaccination record if obtained from local pharmacy or Health Dept. Verbalized acceptance and understanding.  Flu Vaccine status: Due, Education has been provided regarding the importance of this vaccine. Advised may receive this vaccine at local pharmacy or Health Dept. Aware to provide a copy of the vaccination record if obtained from local pharmacy or Health Dept. Verbalized acceptance and understanding.  Pneumococcal vaccine status: Up to date  Covid-19 vaccine status: Completed vaccines  Qualifies for Shingles Vaccine? Yes   Zostavax completed No   Shingrix  Completed?: No.    Education has been provided regarding the importance of this vaccine. Patient has been advised to call insurance company to determine out of pocket expense if they have not yet received this vaccine. Advised may also receive vaccine at local pharmacy or Health Dept. Verbalized acceptance and understanding.  Screening Tests Health Maintenance  Topic Date Due   Zoster Vaccines- Shingrix (1 of 2) Never done   OPHTHALMOLOGY EXAM  06/24/2013   FOOT EXAM  05/23/2019   COVID-19 Vaccine (3 - Booster for Moderna series) 11/10/2019   HEMOGLOBIN A1C  11/29/2019   INFLUENZA VACCINE  08/23/2020   TETANUS/TDAP  09/22/2020   Pneumonia Vaccine 33+ Years old  Completed   DEXA SCAN  Completed   HPV VACCINES  Aged Out    Health Maintenance  Health  Maintenance Due  Topic Date Due   Zoster Vaccines- Shingrix (1 of 2) Never done   OPHTHALMOLOGY EXAM  06/24/2013   FOOT EXAM  05/23/2019   COVID-19 Vaccine (3 - Booster for Moderna series) 11/10/2019   HEMOGLOBIN A1C  11/29/2019   INFLUENZA VACCINE  08/23/2020   TETANUS/TDAP  09/22/2020    Colorectal cancer screening: No longer required.   Mammogram status: No longer required due to AGE.  Bone Density status: Completed 03/25/2008. Results reflect: Bone density results: NORMAL. Repeat every pt declined repeat years.  Lung Cancer Screening: (Low Dose CT Chest recommended if Age 87-80 years, 30 pack-year currently smoking OR have quit w/in 15years.) does not qualify.    Additional Screening:  Hepatitis C Screening: does not qualify.  Vision Screening: Recommended annual ophthalmology exams for early detection of glaucoma and other disorders of the eye. Is the patient up to date with their annual eye exam?  Yes  Who is the provider or what is the name of the office in which the patient attends annual eye exams? Dr. Dione Booze. If pt is not established with a provider, would they like to be referred to a provider to establish care? No .   Dental Screening: Recommended annual dental exams for proper oral hygiene  Community Resource Referral / Chronic Care Management: CRR required this visit?  No   CCM required this visit?  Yes      Plan:     I have personally reviewed and noted the following in the patients chart:   Medical and social history Use of alcohol, tobacco or illicit drugs  Current medications and supplements including opioid prescriptions.  Functional ability and status Nutritional status Physical activity Advanced directives List of other physicians Hospitalizations, surgeries, and ER visits in previous 12 months Vitals Screenings to include cognitive, depression, and falls Referrals and appointments  In addition, I have reviewed and discussed with patient  certain preventive protocols, quality metrics, and best practice recommendations. A written personalized care plan for preventive services as well as general preventive health recommendations were provided to patient.     Darral Dash, LPN   8/78/6767   Nurse Notes: Pt c/o issues of being able to afford Latanoprost eye drops. Offered CCM referral  and pt is agreeable and referral made. Discussed repeat Dexa but pt declines at this time. Discussed flu, shingles and tdap vaccines and how to obtain.

## 2021-02-17 ENCOUNTER — Telehealth: Payer: Medicare HMO

## 2021-03-21 DIAGNOSIS — H401133 Primary open-angle glaucoma, bilateral, severe stage: Secondary | ICD-10-CM | POA: Diagnosis not present

## 2021-03-21 DIAGNOSIS — H04123 Dry eye syndrome of bilateral lacrimal glands: Secondary | ICD-10-CM | POA: Diagnosis not present

## 2021-03-21 DIAGNOSIS — Z961 Presence of intraocular lens: Secondary | ICD-10-CM | POA: Diagnosis not present

## 2021-03-21 DIAGNOSIS — E119 Type 2 diabetes mellitus without complications: Secondary | ICD-10-CM | POA: Diagnosis not present

## 2021-03-22 ENCOUNTER — Telehealth: Payer: Medicare HMO

## 2021-03-22 ENCOUNTER — Telehealth: Payer: Self-pay | Admitting: Pharmacist

## 2021-03-22 NOTE — Telephone Encounter (Signed)
Patient denied needing medication assistance  She can afford medications Cancelled appt Informed patient to call me if she needs assistance in the future

## 2021-04-01 DIAGNOSIS — Z0100 Encounter for examination of eyes and vision without abnormal findings: Secondary | ICD-10-CM | POA: Diagnosis not present

## 2021-07-28 DIAGNOSIS — I10 Essential (primary) hypertension: Secondary | ICD-10-CM | POA: Diagnosis not present

## 2021-07-28 DIAGNOSIS — M2550 Pain in unspecified joint: Secondary | ICD-10-CM | POA: Diagnosis not present

## 2021-07-28 DIAGNOSIS — E119 Type 2 diabetes mellitus without complications: Secondary | ICD-10-CM | POA: Diagnosis not present

## 2021-07-28 DIAGNOSIS — E785 Hyperlipidemia, unspecified: Secondary | ICD-10-CM | POA: Diagnosis not present

## 2021-07-28 DIAGNOSIS — R69 Illness, unspecified: Secondary | ICD-10-CM | POA: Diagnosis not present

## 2021-07-30 DIAGNOSIS — I10 Essential (primary) hypertension: Secondary | ICD-10-CM | POA: Diagnosis not present

## 2021-08-15 ENCOUNTER — Ambulatory Visit: Payer: Self-pay | Admitting: *Deleted

## 2021-08-15 DIAGNOSIS — Z794 Long term (current) use of insulin: Secondary | ICD-10-CM

## 2021-08-15 NOTE — Chronic Care Management (AMB) (Signed)
  Chronic Care Management   Note  08/15/2021 Name: Kimberly Mcdowell MRN: [id number] DOB: April 05, 1930   Patient has either met RN Care Management goals, is stable from [place] Management perspective, or has not recently engaged with the RN Care Manager. I am removing RN Care Manager from Care Team and closing [place]. If patient is currently engaged with another CCM team member I will forward this encounter to inform them of my case closure. Patient may be eligible for re-engagement with RN Care Manager in the future if necessary and can discuss this with their PCP.  Chong Sicilian, BSN, RN-BC Embedded Chronic Care Manager [facility] / [place] [facility] Direct Dial: [phone]

## 2021-08-15 NOTE — Patient Instructions (Signed)
Lonna Lennox Grumbles  I have previously worked with you through the [facility] Program at [place]. Due to program changes I am removing myself from your care team because you've either met our goals, your conditions are stable and no longer require care management, or we haven't engaged within the past 6 months. If you are currently active with another CCM Team Member, you will remain active with them unless they reach out to you with additional information. If you feel that you need RN Care Management services in the future, please talk with your primary care provider to discuss re-engagement with the RN Care Manager that will be assigned to [facility]. This does not affect your status as a patient at [facility].   Thank you for allowing me to participate in your your healthcare journey.  Chong Sicilian, BSN, RN-BC Embedded Chronic Care Manager [facility] / [place] Management Direct Dial: [phone]

## 2021-10-31 DIAGNOSIS — I10 Essential (primary) hypertension: Secondary | ICD-10-CM | POA: Diagnosis not present

## 2021-10-31 DIAGNOSIS — E119 Type 2 diabetes mellitus without complications: Secondary | ICD-10-CM | POA: Diagnosis not present

## 2021-10-31 DIAGNOSIS — E785 Hyperlipidemia, unspecified: Secondary | ICD-10-CM | POA: Diagnosis not present

## 2021-10-31 DIAGNOSIS — R69 Illness, unspecified: Secondary | ICD-10-CM | POA: Diagnosis not present

## 2021-10-31 DIAGNOSIS — M2550 Pain in unspecified joint: Secondary | ICD-10-CM | POA: Diagnosis not present

## 2021-11-21 DIAGNOSIS — E119 Type 2 diabetes mellitus without complications: Secondary | ICD-10-CM | POA: Diagnosis not present

## 2021-11-21 DIAGNOSIS — Z961 Presence of intraocular lens: Secondary | ICD-10-CM | POA: Diagnosis not present

## 2021-11-21 DIAGNOSIS — H04123 Dry eye syndrome of bilateral lacrimal glands: Secondary | ICD-10-CM | POA: Diagnosis not present

## 2021-11-21 DIAGNOSIS — H401133 Primary open-angle glaucoma, bilateral, severe stage: Secondary | ICD-10-CM | POA: Diagnosis not present

## 2022-01-03 DIAGNOSIS — J0110 Acute frontal sinusitis, unspecified: Secondary | ICD-10-CM | POA: Diagnosis not present

## 2022-09-06 ENCOUNTER — Emergency Department (HOSPITAL_COMMUNITY)
Admission: EM | Admit: 2022-09-06 | Discharge: 2022-09-24 | Disposition: E | Payer: Medicare HMO | Attending: Emergency Medicine | Admitting: Emergency Medicine

## 2022-09-06 DIAGNOSIS — Z79899 Other long term (current) drug therapy: Secondary | ICD-10-CM | POA: Diagnosis not present

## 2022-09-06 DIAGNOSIS — I469 Cardiac arrest, cause unspecified: Secondary | ICD-10-CM | POA: Diagnosis not present

## 2022-09-06 DIAGNOSIS — Z87891 Personal history of nicotine dependence: Secondary | ICD-10-CM | POA: Insufficient documentation

## 2022-09-06 DIAGNOSIS — R464 Slowness and poor responsiveness: Secondary | ICD-10-CM | POA: Diagnosis not present

## 2022-09-06 DIAGNOSIS — R0602 Shortness of breath: Secondary | ICD-10-CM | POA: Diagnosis present

## 2022-09-06 DIAGNOSIS — Z7984 Long term (current) use of oral hypoglycemic drugs: Secondary | ICD-10-CM | POA: Insufficient documentation

## 2022-09-06 DIAGNOSIS — R0603 Acute respiratory distress: Secondary | ICD-10-CM | POA: Insufficient documentation

## 2022-09-06 DIAGNOSIS — R5383 Other fatigue: Secondary | ICD-10-CM | POA: Insufficient documentation

## 2022-09-06 DIAGNOSIS — R2243 Localized swelling, mass and lump, lower limb, bilateral: Secondary | ICD-10-CM | POA: Insufficient documentation

## 2022-09-06 DIAGNOSIS — I10 Essential (primary) hypertension: Secondary | ICD-10-CM | POA: Insufficient documentation

## 2022-09-06 DIAGNOSIS — E119 Type 2 diabetes mellitus without complications: Secondary | ICD-10-CM | POA: Diagnosis not present

## 2022-09-06 LAB — CBG MONITORING, ED: Glucose-Capillary: 241 mg/dL — ABNORMAL HIGH (ref 70–99)

## 2022-09-06 MED ORDER — FENTANYL CITRATE PF 50 MCG/ML IJ SOSY
PREFILLED_SYRINGE | INTRAMUSCULAR | Status: AC
  Filled 2022-09-06: qty 2

## 2022-09-06 MED ORDER — AMIODARONE HCL 150 MG/3ML IV SOLN
INTRAVENOUS | Status: AC
  Filled 2022-09-06: qty 3

## 2022-09-06 MED ORDER — SUCCINYLCHOLINE CHLORIDE 200 MG/10ML IV SOSY
PREFILLED_SYRINGE | INTRAVENOUS | Status: AC
  Filled 2022-09-06: qty 10

## 2022-09-06 MED ORDER — MIDAZOLAM HCL 2 MG/2ML IJ SOLN
INTRAMUSCULAR | Status: AC
  Filled 2022-09-06: qty 2

## 2022-09-06 MED ORDER — TENECTEPLASE 50 MG IV KIT
PACK | INTRAVENOUS | Status: AC
  Filled 2022-09-06: qty 10

## 2022-09-06 MED ORDER — ROCURONIUM BROMIDE 10 MG/ML (PF) SYRINGE
PREFILLED_SYRINGE | INTRAVENOUS | Status: AC
  Filled 2022-09-06: qty 10

## 2022-09-06 MED ORDER — KETAMINE HCL 50 MG/5ML IJ SOSY
PREFILLED_SYRINGE | INTRAMUSCULAR | Status: AC
  Filled 2022-09-06: qty 10

## 2022-09-24 NOTE — ED Notes (Signed)
Spoke with [facility] @ [phone]: reference #: [id number]

## 2022-09-24 NOTE — ED Notes (Addendum)
Per EMS, pt has hx. Of hypertension, diabetes, anxiety, and depression.  EMS gave 2 duoneb treatments otw to hospital, 5 of albuterol, 125 Solumedrol, and 700 NS IV in left arm  CPR began @ 0901. Received epinephrine #1 @ 0902. EMS stated he saw movement of her arm at 0903. Received epi #2 @ [id number]. Rocuronium 90 @ 0909. tube inserted @ 0910 - 23 at the lip CBG of 241 Calcium Bicarb. @ 0912 Epi #3 @ 0915. Epi #4 @ 0921 TNKase @ 0922 Calcium Bicarb. @ 0926 2 gram Magnesium @ 0926 Lidocaine @ 0927 Began lucas machine @ 0930 Epi #5 @ 0932 Amiodarone 300mg  @ 0937 Epi #6 @ 0940 Lidocaine @ 0943 Epi #7 @ 0946 Epi #8 @ 0950  2 minute pulse checks began along with CPR @ 0901 through 0951 with asystole and then time of death determined @ 0952 by EDP.  Pt has 2 IV sites: 18G right hand                              20G left a/c

## 2022-09-24 NOTE — ED Provider Notes (Signed)
[place] [facility] AT [facility] Provider Note  CSN: [id number] Arrival date & time: 09/01/2022 1660  Chief Complaint(s) Shortness of Breath  HPI Kimberly Mcdowell is a 89 y.o. female with past medical history as below, significant for DM, hld, htn, obesity, glaucoma who presents to the ED with complaint of dyspnea. Pt arrived 2/2 dyspnea last few days, got duonebs and solumedrol en route with EMS. While transitioning the pt from EMS stretcher to hospital bed pt reported she couldn't breathe and became unresponsive. ACLS protocol was initiated,  Past Medical History Past Medical History:  Diagnosis Date   Arthritis    Diabetes mellitus without complication ([name])    Diabetic neuropathy ([name])    Glaucoma    Hyperlipidemia    Hypertension    Hypokalemia    Osteoporosis    Patient Active Problem List   Diagnosis Date Noted   Primary open angle glaucoma of both eyes, severe stage 08/02/2020   Morbid obesity ([name]) 02/24/2015   Depression 04/29/2014   Peripheral edema 01/28/2014   Hypertension 04/09/2010   Arthritis 04/09/2010   Diabetes mellitus ([name]) 04/09/2010   GAD (generalized anxiety disorder) 04/09/2010   Hyperlipidemia 04/09/2010   Diabetic neuropathy ([name]) 04/09/2010   Home Medication(s) Prior to Admission medications   Medication Sig Start Date End Date Taking? Authorizing Provider  ALPRAZolam (XANAX) 0.25 MG tablet Take 1 tablet (0.25 mg total) by mouth at bedtime. 05/29/19   Daphine Deutscher, Mary-Margaret, FNP  amLODipine (NORVASC) 5 MG tablet Take 1 tablet (5 mg total) by mouth daily. 05/29/19   Daphine Deutscher, Mary-Margaret, FNP  atorvastatin (LIPITOR) 40 MG tablet Take 1 tablet (40 mg total) by mouth daily. (NEEDS TO BE SEEN BEFORE NEXT REFILL) 05/25/20   Bennie Pierini, FNP  azithromycin (ZITHROMAX Z-PAK) 250 MG tablet As directed 04/19/20   Daphine Deutscher, Mary-Margaret, FNP  benzonatate (TESSALON PERLES) 100 MG capsule Take 1 capsule (100 mg total) by mouth 3 (three)  times daily as needed. 04/19/20   Daphine Deutscher, Mary-Margaret, FNP  brimonidine (ALPHAGAN) 0.2 % ophthalmic solution 1 drop 2 (two) times daily. 09/06/20   [provider]  cephALEXin (KEFLEX) 500 MG capsule TAKE 1 CAPSULE BY MOUTH 2 TIMES DAILY. 06/20/19   Daphine Deutscher Mary-Margaret, FNP  Cholecalciferol (VITAMIN D3) 2000 UNITS TABS Take 2,000 Int'l Units by mouth daily.      [provider]  citalopram (CELEXA) 40 MG tablet Take 1 tablet (40 mg total) by mouth daily. 05/29/19   Daphine Deutscher, Mary-Margaret, FNP  dorzolamide-timolol (COSOPT) 22.3-6.8 MG/ML ophthalmic solution 1 drop 2 (two) times daily. 09/06/20   [provider]  furosemide (LASIX) 40 MG tablet Take 1 tablet (40 mg total) by mouth daily. 05/29/19   Daphine Deutscher, Mary-Margaret, FNP  gabapentin (NEURONTIN) 100 MG capsule Take 1 capsule (100 mg total) by mouth at bedtime. (Needs to be seen before next refill) 05/29/19   Daphine Deutscher, Mary-Margaret, FNP  glipiZIDE (GLIPIZIDE XL) 2.5 MG 24 hr tablet Take 1 tablet (2.5 mg total) by mouth daily. 05/29/19   Daphine Deutscher, Mary-Margaret, FNP  glucose blood (ACCU-CHEK SMARTVIEW) test strip Test blood glucose up to three times a day 08/23/16   Daphine Deutscher, Mary-Margaret, FNP  latanoprost (XALATAN) 0.005 % ophthalmic solution 1 drop at bedtime. 09/06/20   [provider]  lisinopril (ZESTRIL) 20 MG tablet Take 1 tablet (20 mg total) by mouth daily. (NEEDS TO BE SEEN BEFORE NEXT REFILL) 05/25/20   Bennie Pierini, FNP  metFORMIN (GLUCOPHAGE) 1000 MG tablet Take 1 Tablet by mouth 2 times a  day with a meal 05/29/19   Daphine Deutscher, Mary-Margaret, FNP  metoprolol tartrate (LOPRESSOR) 50 MG tablet Take 1 tablet (50 mg total) by mouth 2 (two) times daily. 05/29/19   Bennie Pierini, FNP                                                                                                                                    Past Surgical History Past Surgical History:  Procedure Laterality Date   ABDOMINAL HYSTERECTOMY      APPENDECTOMY     CHOLECYSTECTOMY     FRACTURE SURGERY     Family History Family History  Problem Relation Age of Onset   Stroke Mother    Arthritis Father    Arthritis Sister    Ovarian cancer Sister    Cancer Sister     Social History Social History   Tobacco Use   Smoking status: Former    Current packs/day: 3.00    Average packs/day: 3.0 packs/day for 15.0 years (45.0 ttl pk-yrs)    Types: Cigarettes   Smokeless tobacco: Never  Vaping Use   Vaping status: Never Used  Substance Use Topics   Alcohol use: No   Drug use: No   Allergies Ezetimibe-simvastatin and Lipitor [atorvastatin calcium]  Review of Systems Review of Systems  Unable to perform ROS: Patient unresponsive    Physical Exam Vital Signs  I have reviewed the triage vital signs Ht 5\' 5"  (1.651 m)   Wt 99.8 kg   BMI 36.61 kg/m  Physical Exam Constitutional:      General: She is in acute distress.     Appearance: She is obese. She is ill-appearing.     Comments: ashen  HENT:     Head: Normocephalic and atraumatic.     Right Ear: External ear normal.     Left Ear: External ear normal.     Nose: Nose normal.  Eyes:     General: No scleral icterus.       Right eye: No discharge.        Left eye: No discharge.  Cardiovascular:     Rate and Rhythm: Tachycardia present.     Pulses: Normal pulses.  Pulmonary:     Effort: Respiratory distress present.     Breath sounds: No wheezing.     Comments: Diminished breath sounds b/l Abdominal:     General: Abdomen is flat.     Palpations: Abdomen is soft.  Musculoskeletal:     Cervical back: No rigidity.     Right lower leg: Edema present.     Left lower leg: Edema present.     Comments: Trace BL LE edema   Skin:    General: Skin is warm and dry.     Capillary Refill: Capillary refill takes 2 to 3 seconds.  Neurological:     Mental Status: She is lethargic.     ED Results and Treatments Labs (all  labs ordered are listed, but only abnormal  results are displayed) Labs Reviewed  CBG MONITORING, ED - Abnormal; Notable for the following components:      Result Value   Glucose-Capillary 241 (*)    All other components within normal limits                                                                                                                          Radiology No results found.  Pertinent labs & imaging results that were available during my care of the patient were reviewed by me and considered in my medical decision making (see MDM for details).  Medications Ordered in ED Medications  rocuronium bromide 100 MG/10ML SOSY (has no administration in time range)  tenecteplase (TNKASE) 50 MG injection for PE/MI (has no administration in time range)  amiodarone (CORDARONE) 150 MG/3ML injection (has no administration in time range)                                                                                                                                     Procedures Procedure Name: Intubation Date/Time: 09/01/2022 10:16 AM  Performed by: Sloan Leiter, DOPre-anesthesia Checklist: Patient identified, Emergency Drugs available, Suction available, Timeout performed and Patient being monitored Oxygen Delivery Method: Non-rebreather mask Preoxygenation: Pre-oxygenation with 100% oxygen Induction Type: Rapid sequence Ventilation: Mask ventilation with difficulty Laryngoscope Size: Glidescope Grade View: Grade I Tube size: 7.5 mm Number of attempts: 1 Placement Confirmation: ETT inserted through vocal cords under direct vision, CO2 detector and Breath sounds checked- equal and bilateral Secured at: 23 cm Tube secured with: ETT holder Difficulty Due To: Difficulty was anticipated Future Recommendations: Recommend- induction with short-acting agent, and alternative techniques readily available    CPR  Date/Time: 08/24/2022 10:17 AM  Performed by: Sloan Leiter, DO Authorized by: Sloan Leiter, DO  CPR Procedure  Details:    ACLS/BLS initiated by EMS: No     CPR/ACLS performed in the ED: Yes     Duration of CPR (minutes):  52   Outcome: Pt declared dead    CPR performed via ACLS guidelines under my direct supervision.  See RN documentation for details including defibrillator use, medications, doses and timing.   (including critical care time)  Medical Decision Making / ED Course    Medical Decision Making:    MARTY PICKET is a 89 y.o.  female with past medical history as below, significant for DM, hld, htn, obesity, glaucoma who presents to the ED with complaint of dyspnea. . The complaint involves an extensive differential diagnosis and also carries with it a high risk of complications and morbidity.  Serious etiology was considered. Ddx includes but is not limited to: In my evaluation of this patient's dyspnea my DDx includes, but is not limited to, pneumonia, pulmonary embolism, pneumothorax, pulmonary edema, metabolic acidosis, asthma, COPD, cardiac cause, anemia, anxiety, etc.    Complete initial physical exam performed, notably the patient  was initially responsive but conversational dyspnea. Pulses lost shortly after arrival.    Reviewed and confirmed nursing documentation for past medical history, family history, social history.  Vital signs reviewed.       Narrative: 89 yo female w/ hx as above -Here w/ complaint of dyspnea -Pulses lost shortly after arrival -ACLS protocol initiated -Pt intubated by myself, see note -Pulse check with vfib s/p multiple defibrillations, amiodarone and lidocaine, possible torsades at one point at was given magnesium sulfate  -Also treated with epinephrine, sodium bicarb/calcium per ACLS protocol -She was also given TNKase -TOD called at [phone], PCP/family notified   Refer to ED code summary for full details on medications and procedures. Asystole was confirmed by 3 lead EKG and bedside US.  Likely cause of death is cardiac arrest. PCP, Rebekah Chesterfield, NP at [phone]   was notified . This is not a medical examiner case. PCP will sign the death certificate. Family member, Onalee Hua who is pt's son was at bedside to identify the pt.                 Additional history obtained: -Additional history obtained from EMS/son -External records from outside source obtained and reviewed including: Chart review including previous notes, labs, imaging, consultation notes including  Home meds    Lab Tests: -I ordered, reviewed, and interpreted labs.   The pertinent results include:   Labs Reviewed  CBG MONITORING, ED - Abnormal; Notable for the following components:      Result Value   Glucose-Capillary 241 (*)    All other components within normal limits    Notable for cbg stable  EKG   EKG Interpretation Date/Time:    Ventricular Rate:    PR Interval:    QRS Duration:    QT Interval:    QTC Calculation:   R Axis:      Text Interpretation:           Imaging Studies ordered: na   Medicines ordered and prescription drug management: Meds ordered this encounter  Medications   rocuronium bromide 100 MG/10ML Billey Gosling, Jessica: cabinet override   DISCONTD: succinylcholine (ANECTINE) 200 MG/10ML syringe    Sadie Haber: cabinet override   DISCONTD: fentaNYL (SUBLIMAZE) 50 MCG/ML injection    Ezzard Standing, Jessica: cabinet override   DISCONTD: ketamine HCl 50 MG/5ML Billey Gosling, Jessica: cabinet override   DISCONTD: midazolam (VERSED) 2 MG/2ML injection    Sadie Haber: cabinet override   tenecteplase (TNKASE) 50 MG injection for PE/MI    White, Meagan R: cabinet override   amiodarone (CORDARONE) 150 MG/3ML injection    Duwaine Maxin: cabinet override    -I have reviewed the patients home medicines and have made adjustments as needed   Consultations Obtained: na   Cardiac Monitoring: na  Social Determinants of Health:  Diagnosis or treatment significantly limited by social  determinants of health: former  smoker   Reevaluation: Expired  Co morbidities that complicate the patient evaluation  Past Medical History:  Diagnosis Date   Arthritis    Diabetes mellitus without complication ([name])    Diabetic neuropathy ([name])    Glaucoma    Hyperlipidemia    Hypertension    Hypokalemia    Osteoporosis       Dispostion: Disposition decision including need for hospitalization was considered, and patient Expired    Final Clinical Impression(s) / ED Diagnoses Final diagnoses:  Cardiac arrest ([name])        Sloan Leiter, DO 08/28/2022 1612

## 2022-09-24 NOTE — ED Triage Notes (Signed)
Pt brought in by [facility] from home with c/o SOB x 1 week. EMS arrived at her home and reports O2 sat 97% on RA with lung sounds clear but "tight". Pt was given Duoneb and Solumedrol 125mg  IV with improvement in breathing. After a little while, pt started c/o difficulty breathing again and EMT-P reports pt's lung sounds on the right became very diminished. Pt's BP starting dropping and NS given with improvement in BP to normal limits after bolus was given. Another Duoneb was given and a few minutes before arriving to ED pt's O2 sat started decreasing to 78% with the breathing treatment being administered. Pt arrived to ED alert and talking stating she couldn't breathe. Pt was moved over to ED stretcher and within minutes pt became unresponsive and lost pulses. CPR was initiated immediately by Dr. Wallace Cullens.

## 2022-09-24 DEATH — deceased
